# Patient Record
Sex: Female | Born: 1990 | Hispanic: Yes | Marital: Single | Smoking: Never smoker
Health system: Southern US, Community
[De-identification: ages and names within clinical notes are randomized; demographics above are authoritative.]

## PROBLEM LIST (undated history)

## (undated) ENCOUNTER — Inpatient Hospital Stay (HOSPITAL_COMMUNITY): Payer: Self-pay

## (undated) DIAGNOSIS — D696 Thrombocytopenia, unspecified: Secondary | ICD-10-CM

## (undated) HISTORY — PX: WISDOM TOOTH EXTRACTION: SHX21

---
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
## 2008-09-08 ENCOUNTER — Ambulatory Visit: Payer: Self-pay | Admitting: Family Medicine

## 2008-09-08 LAB — CONVERTED CEMR LAB
Anti Nuclear Antibody(ANA): NEGATIVE
Rhuematoid fact SerPl-aCnc: <20 intl units/mL (ref 0–20)
TSH: 0.904 microintl units/mL (ref 0.350–4.500)

## 2008-09-13 ENCOUNTER — Ambulatory Visit (HOSPITAL_COMMUNITY): Admission: RE | Admit: 2008-09-13 | Discharge: 2008-09-13 | Payer: Self-pay | Admitting: Family Medicine

## 2009-12-19 ENCOUNTER — Emergency Department (HOSPITAL_COMMUNITY): Admission: EM | Admit: 2009-12-19 | Discharge: 2009-12-19 | Payer: Self-pay | Admitting: Emergency Medicine

## 2011-05-20 NOTE — L&D Delivery Note (Signed)
Delivery Note At 7:07 PM a viable female was delivered via  (Presentation: ;  ).  APGAR: , ; weight .   Placenta status: , .  Cord:  with the following complications: .  Cord pH: not done  Anesthesia:   Episiotomy:  Lacerations:  Suture Repair: 2.0 vicryl Est. Blood Loss (mL):   Mom to postpartum.  Baby to nursery-stable.  MARSHALL,BERNARD A 01/10/2012, 7:16 PM

## 2011-09-08 LAB — OB RESULTS CONSOLE ABO/RH

## 2011-09-08 LAB — OB RESULTS CONSOLE ANTIBODY SCREEN: Antibody Screen: NEGATIVE

## 2011-09-08 LAB — OB RESULTS CONSOLE GC/CHLAMYDIA
Chlamydia: NEGATIVE
Gonorrhea: NEGATIVE

## 2011-09-08 LAB — OB RESULTS CONSOLE HIV ANTIBODY (ROUTINE TESTING): HIV: NONREACTIVE

## 2012-01-08 ENCOUNTER — Other Ambulatory Visit: Payer: Self-pay | Admitting: Obstetrics

## 2012-01-09 ENCOUNTER — Encounter (HOSPITAL_COMMUNITY): Payer: Self-pay | Admitting: *Deleted

## 2012-01-09 ENCOUNTER — Telehealth (HOSPITAL_COMMUNITY): Payer: Self-pay | Admitting: *Deleted

## 2012-01-09 NOTE — Telephone Encounter (Signed)
Preadmission screen  

## 2012-01-10 ENCOUNTER — Inpatient Hospital Stay (HOSPITAL_COMMUNITY)
Admission: AD | Admit: 2012-01-10 | Discharge: 2012-01-12 | DRG: 775 | Disposition: A | Discharge status: Home / Self Care | Payer: Medicaid Other | Source: Ambulatory Visit | Attending: Obstetrics | Admitting: Obstetrics

## 2012-01-10 ENCOUNTER — Encounter (HOSPITAL_COMMUNITY): Payer: Self-pay | Admitting: Anesthesiology

## 2012-01-10 ENCOUNTER — Encounter (HOSPITAL_COMMUNITY): Payer: Self-pay

## 2012-01-10 ENCOUNTER — Inpatient Hospital Stay (HOSPITAL_COMMUNITY): Payer: Medicaid Other | Admitting: Anesthesiology

## 2012-01-10 ENCOUNTER — Encounter (HOSPITAL_COMMUNITY): Payer: Self-pay | Admitting: *Deleted

## 2012-01-10 LAB — CBC
HCT: 40 % (ref 36.0–46.0)
Hemoglobin: 14 g/dL (ref 12.0–15.0)
MCV: 94.6 fL (ref 78.0–100.0)
RBC: 4.23 MIL/uL (ref 3.87–5.11)
WBC: 15.6 10*3/uL — ABNORMAL HIGH (ref 4.0–10.5)

## 2012-01-10 LAB — RPR: RPR Ser Ql: NONREACTIVE

## 2012-01-10 LAB — TYPE AND SCREEN: ABO/RH(D): O POS

## 2012-01-10 LAB — ABO/RH: ABO/RH(D): O POS

## 2012-01-10 MED ORDER — SENNOSIDES-DOCUSATE SODIUM 8.6-50 MG PO TABS
2.0000 | ORAL_TABLET | Freq: Every day | ORAL | Status: DC
  Administered 2012-01-10 – 2012-01-11 (×2): 2 via ORAL

## 2012-01-10 MED ORDER — LANOLIN HYDROUS EX OINT: TOPICAL_OINTMENT | CUTANEOUS | Status: DC | PRN

## 2012-01-10 MED ORDER — FLEET ENEMA 7-19 GM/118ML RE ENEM: 1.0000 | ENEMA | RECTAL | Status: DC | PRN

## 2012-01-10 MED ORDER — PROMETHAZINE HCL 25 MG/ML IJ SOLN: 12.5000 mg | Freq: Four times a day (QID) | INTRAMUSCULAR | Status: DC | PRN

## 2012-01-10 MED ORDER — LACTATED RINGERS IV SOLN
500.0000 mL | Freq: Once | INTRAVENOUS | Status: AC
  Administered 2012-01-10: 500 mL via INTRAVENOUS

## 2012-01-10 MED ORDER — ZOLPIDEM TARTRATE 5 MG PO TABS: 5.0000 mg | ORAL_TABLET | Freq: Every evening | ORAL | Status: DC | PRN

## 2012-01-10 MED ORDER — LACTATED RINGERS IV SOLN
500.0000 mL | INTRAVENOUS | Status: DC | PRN
  Administered 2012-01-10: 1000 mL via INTRAVENOUS

## 2012-01-10 MED ORDER — BUTORPHANOL TARTRATE 1 MG/ML IJ SOLN
1.0000 mg | INTRAMUSCULAR | Status: DC | PRN
  Administered 2012-01-10: 1 mg via INTRAVENOUS
  Filled 2012-01-10: qty 1

## 2012-01-10 MED ORDER — PRENATAL MULTIVITAMIN CH
1.0000 | ORAL_TABLET | Freq: Every day | ORAL | Status: DC
  Administered 2012-01-12: 1 via ORAL
  Filled 2012-01-10: qty 1

## 2012-01-10 MED ORDER — PHENYLEPHRINE 40 MCG/ML (10ML) SYRINGE FOR IV PUSH (FOR BLOOD PRESSURE SUPPORT): 80.0000 ug | PREFILLED_SYRINGE | INTRAVENOUS | Status: DC | PRN

## 2012-01-10 MED ORDER — OXYCODONE-ACETAMINOPHEN 5-325 MG PO TABS
1.0000 | ORAL_TABLET | ORAL | Status: DC | PRN
  Administered 2012-01-11 – 2012-01-12 (×2): 1 via ORAL
  Filled 2012-01-10 (×2): qty 1

## 2012-01-10 MED ORDER — LACTATED RINGERS IV SOLN
INTRAVENOUS | Status: DC
  Administered 2012-01-10 (×2): via INTRAVENOUS

## 2012-01-10 MED ORDER — IBUPROFEN 600 MG PO TABS
600.0000 mg | ORAL_TABLET | Freq: Four times a day (QID) | ORAL | Status: DC | PRN
  Administered 2012-01-11: 600 mg via ORAL
  Filled 2012-01-10 (×2): qty 1

## 2012-01-10 MED ORDER — TETANUS-DIPHTH-ACELL PERTUSSIS 5-2.5-18.5 LF-MCG/0.5 IM SUSP
0.5000 mL | Freq: Once | INTRAMUSCULAR | Status: AC
  Administered 2012-01-12: 0.5 mL via INTRAMUSCULAR
  Filled 2012-01-10: qty 0.5

## 2012-01-10 MED ORDER — DIPHENHYDRAMINE HCL 25 MG PO CAPS: 25.0000 mg | ORAL_CAPSULE | Freq: Four times a day (QID) | ORAL | Status: DC | PRN

## 2012-01-10 MED ORDER — OXYTOCIN 40 UNITS IN LACTATED RINGERS INFUSION - SIMPLE MED
62.5000 mL/h | Freq: Once | INTRAVENOUS | Status: AC
  Administered 2012-01-10: 62.5 mL/h via INTRAVENOUS
  Filled 2012-01-10: qty 1000

## 2012-01-10 MED ORDER — SIMETHICONE 80 MG PO CHEW: 80.0000 mg | CHEWABLE_TABLET | ORAL | Status: DC | PRN

## 2012-01-10 MED ORDER — ONDANSETRON HCL 4 MG/2ML IJ SOLN: 4.0000 mg | Freq: Four times a day (QID) | INTRAMUSCULAR | Status: DC | PRN

## 2012-01-10 MED ORDER — LIDOCAINE HCL (PF) 1 % IJ SOLN
30.0000 mL | INTRAMUSCULAR | Status: DC | PRN
  Filled 2012-01-10: qty 30

## 2012-01-10 MED ORDER — FENTANYL 2.5 MCG/ML BUPIVACAINE 1/10 % EPIDURAL INFUSION (WH - ANES)
INTRAMUSCULAR | Status: DC | PRN
  Administered 2012-01-10: 14 mL/h via EPIDURAL

## 2012-01-10 MED ORDER — IBUPROFEN 600 MG PO TABS
600.0000 mg | ORAL_TABLET | Freq: Four times a day (QID) | ORAL | Status: DC
  Administered 2012-01-10 – 2012-01-12 (×5): 600 mg via ORAL
  Filled 2012-01-10 (×3): qty 1

## 2012-01-10 MED ORDER — OXYCODONE-ACETAMINOPHEN 5-325 MG PO TABS
1.0000 | ORAL_TABLET | ORAL | Status: DC | PRN
  Administered 2012-01-11: 1 via ORAL
  Filled 2012-01-10: qty 1

## 2012-01-10 MED ORDER — ONDANSETRON HCL 4 MG PO TABS: 4.0000 mg | ORAL_TABLET | ORAL | Status: DC | PRN

## 2012-01-10 MED ORDER — BENZOCAINE-MENTHOL 20-0.5 % EX AERO
1.0000 "application " | INHALATION_SPRAY | CUTANEOUS | Status: DC | PRN
  Administered 2012-01-11: 1 via TOPICAL
  Filled 2012-01-10: qty 56

## 2012-01-10 MED ORDER — OXYTOCIN BOLUS FROM INFUSION
250.0000 mL | Freq: Once | INTRAVENOUS | Status: DC
  Filled 2012-01-10: qty 500

## 2012-01-10 MED ORDER — DIBUCAINE 1 % RE OINT: 1.0000 "application " | TOPICAL_OINTMENT | RECTAL | Status: DC | PRN

## 2012-01-10 MED ORDER — FERROUS SULFATE 325 (65 FE) MG PO TABS
325.0000 mg | ORAL_TABLET | Freq: Two times a day (BID) | ORAL | Status: DC
  Administered 2012-01-11 – 2012-01-12 (×3): 325 mg via ORAL
  Filled 2012-01-10 (×3): qty 1

## 2012-01-10 MED ORDER — DIPHENHYDRAMINE HCL 50 MG/ML IJ SOLN: 12.5000 mg | INTRAMUSCULAR | Status: DC | PRN

## 2012-01-10 MED ORDER — LIDOCAINE HCL (PF) 1 % IJ SOLN
INTRAMUSCULAR | Status: DC | PRN
  Administered 2012-01-10: 4 mL
  Administered 2012-01-10: 30 mL
  Administered 2012-01-10: 4 mL

## 2012-01-10 MED ORDER — ACETAMINOPHEN 325 MG PO TABS: 650.0000 mg | ORAL_TABLET | ORAL | Status: DC | PRN

## 2012-01-10 MED ORDER — EPHEDRINE 5 MG/ML INJ: 10.0000 mg | INTRAVENOUS | Status: DC | PRN

## 2012-01-10 MED ORDER — EPHEDRINE 5 MG/ML INJ
10.0000 mg | INTRAVENOUS | Status: DC | PRN
  Filled 2012-01-10: qty 4

## 2012-01-10 MED ORDER — ONDANSETRON HCL 4 MG/2ML IJ SOLN: 4.0000 mg | INTRAMUSCULAR | Status: DC | PRN

## 2012-01-10 MED ORDER — CITRIC ACID-SODIUM CITRATE 334-500 MG/5ML PO SOLN: 30.0000 mL | ORAL | Status: DC | PRN

## 2012-01-10 MED ORDER — FENTANYL 2.5 MCG/ML BUPIVACAINE 1/10 % EPIDURAL INFUSION (WH - ANES)
14.0000 mL/h | INTRAMUSCULAR | Status: DC
  Administered 2012-01-10: 14 mL/h via EPIDURAL
  Filled 2012-01-10 (×2): qty 60

## 2012-01-10 MED ORDER — WITCH HAZEL-GLYCERIN EX PADS: 1.0000 "application " | MEDICATED_PAD | CUTANEOUS | Status: DC | PRN

## 2012-01-10 MED ORDER — PHENYLEPHRINE 40 MCG/ML (10ML) SYRINGE FOR IV PUSH (FOR BLOOD PRESSURE SUPPORT)
80.0000 ug | PREFILLED_SYRINGE | INTRAVENOUS | Status: DC | PRN
  Filled 2012-01-10: qty 5

## 2012-01-10 NOTE — Anesthesia Procedure Notes (Addendum)
Epidural Patient location during procedure: OB Start time: 01/10/2012 12:34 PM  Staffing Anesthesiologist: Malayjah Otoole A. Performed by: anesthesiologist   Preanesthetic Checklist Completed: patient identified, site marked, surgical consent, pre-op evaluation, timeout performed, IV checked, risks and benefits discussed and monitors and equipment checked  Epidural Patient position: sitting Prep: site prepped and draped and DuraPrep Patient monitoring: continuous pulse ox and blood pressure Approach: midline Injection technique: LOR air  Needle:  Needle type: Tuohy  Needle gauge: 17 G Needle length: 9 cm Needle insertion depth: 5 cm cm Catheter type: closed end flexible Catheter size: 19 Gauge Catheter at skin depth: 10 cm Test dose: negative and Other  Assessment Events: blood not aspirated, injection not painful, no injection resistance, negative IV test and no paresthesia  Additional Notes Patient identified. Risks and benefits discussed including failed block, incomplete  Pain control, post dural puncture headache, nerve damage, paralysis, blood pressure Changes, nausea, vomiting, reactions to medications-both toxic and allergic and post Partum back pain. All questions were answered. Patient expressed understanding and wished to proceed. Sterile technique was used throughout procedure. Epidural site was Dressed with sterile barrier dressing. No paresthesias, signs of intravascular injection Or signs of intrathecal spread were encountered.  Patient was more comfortable after the epidural was dosed. Please see RN's note for documentation of vital signs and FHR which are stable. Will recheck platelet count prior to removal of epidural.

## 2012-01-10 NOTE — H&P (Signed)
This is Dr. Francoise Ceo dictating the history and physical on Brittany Vargas she's a 21 year old gravida 3 para [id number] at 40 weeks and 4 days admitted in labor [facility] 01/06/2012 she is on low-dose Pitocin her membranes were ruptured artificially the fluid was clear negative GBS IUPC inserted and she is now 5-6 cm dilated 100% effaced with the vertex at a 0 station Past medical history negative Past surgical history negative Social history negative Physical exam well-developed female in labor HEENT negative Lungs clear to P&A Breasts negative Heart regular rhythm no murmurs no gallops Abdomen term Pelvic as described above Extremities negative

## 2012-01-10 NOTE — Progress Notes (Signed)
Transferred to 108 via stretcher with infant

## 2012-01-10 NOTE — Progress Notes (Signed)
Delivery of live viable female by Dr Federico Flake 9,9

## 2012-01-10 NOTE — MAU Note (Signed)
Contractions strong since yesterday every 5 minutes patient is G1 [redacted]w[redacted]d, some bloody show.

## 2012-01-10 NOTE — Anesthesia Preprocedure Evaluation (Signed)

## 2012-01-11 LAB — CBC
MCHC: 34.4 g/dL (ref 30.0–36.0)
RDW: 13.5 % (ref 11.5–15.5)
WBC: 16.1 10*3/uL — ABNORMAL HIGH (ref 4.0–10.5)

## 2012-01-11 NOTE — Progress Notes (Signed)
Patient ID: Brittany Vargas, female   DOB: July 30, 1990, 21 y.o.   MRN: [id number] Postpartum day one Vital signs normal Fundus firm Legs negative No complaints

## 2012-01-11 NOTE — Anesthesia Postprocedure Evaluation (Signed)
  Anesthesia Post-op Note  Patient: Brittany Vargas  Procedure(s) Performed: * No procedures listed *  Patient Location: PACU and Women's Unit  Anesthesia Type: Epidural  Level of Consciousness: awake, alert  and oriented  Airway and Oxygen Therapy: Patient Spontanous Breathing  Post-op Pain: mild  Post-op Assessment: Patient's Cardiovascular Status Stable and Respiratory Function Stable  Post-op Vital Signs: Reviewed and stable  Complications: No apparent anesthesia complications

## 2012-01-12 MED ORDER — IBUPROFEN 600 MG PO TABS: 600.0000 mg | ORAL_TABLET | Freq: Four times a day (QID) | ORAL | Status: AC | PRN

## 2012-01-12 MED ORDER — OXYCODONE-ACETAMINOPHEN 5-325 MG PO TABS: 1.0000 | ORAL_TABLET | ORAL | Status: AC | PRN

## 2012-01-12 NOTE — Progress Notes (Signed)
UR chart review completed.  

## 2012-01-12 NOTE — Progress Notes (Signed)
Post Partum Day 2 Subjective: no complaints  Objective: Blood pressure 90/54, pulse 71, temperature 98.4 F (36.9 C), temperature source Oral, resp. rate 18, height 5\' 4"  (1.626 m), weight 174 lb 9.6 oz (79.198 kg), last menstrual period 04/01/2011, unknown if currently breastfeeding.  Physical Exam:  General: alert and no distress Lochia: appropriate Uterine Fundus: firm Incision: healing well DVT Evaluation: No evidence of DVT seen on physical exam.   Basename 01/11/12 0500 01/10/12 0930  HGB 10.9* 14.0  HCT 31.7* 40.0    Assessment/Plan: Discharge home   LOS: 2 days   HARPER,CHARLES A 01/12/2012, 8:28 AM

## 2012-01-12 NOTE — Discharge Summary (Signed)
Obstetric Discharge Summary Reason for Admission: onset of labor Prenatal Procedures: none Intrapartum Procedures: spontaneous vaginal delivery Postpartum Procedures: none Complications-Operative and Postpartum: none Hemoglobin  Date Value Range Status  01/11/2012 10.9* 12.0 - 15.0 g/dL Final     DELTA CHECK NOTED     REPEATED TO VERIFY     HCT  Date Value Range Status  01/11/2012 31.7* 36.0 - 46.0 % Final    Physical Exam:  General: alert and no distress Lochia: appropriate Uterine Fundus: firm Incision: healing well DVT Evaluation: No evidence of DVT seen on physical exam.  Discharge Diagnoses: Term Pregnancy-delivered  Discharge Information: Date: 01/12/2012 Activity: pelvic rest Diet: routine Medications: PNV, Ibuprofen, Colace and Percocet Condition: stable Instructions: refer to practice specific booklet Discharge to: home Follow-up Information    Follow up with Rajah Tagliaferro A, MD. Schedule an appointment as soon as possible for a visit in 6 weeks.   Contact information:   [address] [place] [address] [phone]          Newborn Data: Live born female  Birth Weight: 8 lb 5.2 oz (3775 g) APGAR: 9, 9  Home with mother.  Brittany Vargas A 01/12/2012, 8:33 AM

## 2012-01-14 ENCOUNTER — Inpatient Hospital Stay (HOSPITAL_COMMUNITY): Admission: RE | Admit: 2012-01-14 | Payer: Self-pay | Source: Ambulatory Visit

## 2012-03-01 ENCOUNTER — Inpatient Hospital Stay (HOSPITAL_COMMUNITY)
Admission: AD | Admit: 2012-03-01 | Discharge: 2012-03-05 | DRG: 690 | Disposition: A | Discharge status: Home / Self Care | Payer: Medicaid Other | Source: Ambulatory Visit | Attending: Obstetrics | Admitting: Obstetrics

## 2012-03-01 ENCOUNTER — Encounter (HOSPITAL_COMMUNITY): Payer: Self-pay | Admitting: Obstetrics

## 2012-03-01 DIAGNOSIS — N10 Acute tubulo-interstitial nephritis: Principal | ICD-10-CM | POA: Diagnosis present

## 2012-03-01 DIAGNOSIS — A498 Other bacterial infections of unspecified site: Secondary | ICD-10-CM | POA: Diagnosis present

## 2012-03-01 LAB — CBC WITH DIFFERENTIAL/PLATELET
Basophils Relative: 0 % (ref 0–1)
Eosinophils Absolute: 0 10*3/uL (ref 0.0–0.7)
Lymphs Abs: 0.7 10*3/uL (ref 0.7–4.0)
MCH: 31.4 pg (ref 26.0–34.0)
MCHC: 34.9 g/dL (ref 30.0–36.0)
Neutro Abs: 6.2 10*3/uL (ref 1.7–7.7)
Neutrophils Relative %: 79 % — ABNORMAL HIGH (ref 43–77)
Platelets: 103 10*3/uL — ABNORMAL LOW (ref 150–400)
RBC: 4.33 MIL/uL (ref 3.87–5.11)

## 2012-03-01 LAB — COMPREHENSIVE METABOLIC PANEL
AST: 103 U/L — ABNORMAL HIGH (ref 0–37)
CO2: 24 mEq/L (ref 19–32)
Chloride: 96 mEq/L (ref 96–112)
Creatinine, Ser: 0.74 mg/dL (ref 0.50–1.10)
GFR calc Af Amer: >90 mL/min (ref >90)
GFR calc non Af Amer: >90 mL/min (ref >90)
Glucose, Bld: 99 mg/dL (ref 70–99)
Total Bilirubin: 0.6 mg/dL (ref 0.3–1.2)

## 2012-03-01 MED ORDER — DEXTROSE-NACL 5-0.45 % IV SOLN
INTRAVENOUS | Status: DC
  Administered 2012-03-01 – 2012-03-05 (×9): via INTRAVENOUS

## 2012-03-01 MED ORDER — OXYCODONE HCL 5 MG PO TABS
10.0000 mg | ORAL_TABLET | ORAL | Status: DC | PRN
  Administered 2012-03-02 – 2012-03-04 (×5): 10 mg via ORAL
  Filled 2012-03-01 (×2): qty 1
  Filled 2012-03-01 (×5): qty 2

## 2012-03-01 MED ORDER — ALUM & MAG HYDROXIDE-SIMETH 200-200-20 MG/5ML PO SUSP: 30.0000 mL | Freq: Four times a day (QID) | ORAL | Status: DC | PRN

## 2012-03-01 MED ORDER — DOCUSATE SODIUM 100 MG PO CAPS
100.0000 mg | ORAL_CAPSULE | Freq: Two times a day (BID) | ORAL | Status: DC
  Administered 2012-03-01 – 2012-03-04 (×7): 100 mg via ORAL
  Filled 2012-03-01 (×7): qty 1

## 2012-03-01 MED ORDER — OXYCODONE HCL 5 MG PO TABS
5.0000 mg | ORAL_TABLET | ORAL | Status: DC | PRN
  Administered 2012-03-01: 5 mg via ORAL
  Filled 2012-03-01: qty 1

## 2012-03-01 MED ORDER — ADULT MULTIVITAMIN W/MINERALS CH
1.0000 | ORAL_TABLET | Freq: Every day | ORAL | Status: DC
  Administered 2012-03-01 – 2012-03-04 (×4): 1 via ORAL
  Filled 2012-03-01 (×5): qty 1

## 2012-03-01 MED ORDER — PROMETHAZINE HCL 25 MG/ML IJ SOLN
25.0000 mg | Freq: Four times a day (QID) | INTRAMUSCULAR | Status: DC | PRN
  Administered 2012-03-03: 25 mg via INTRAMUSCULAR
  Filled 2012-03-01 (×2): qty 1

## 2012-03-01 MED ORDER — ACETAMINOPHEN 500 MG PO TABS
1000.0000 mg | ORAL_TABLET | Freq: Four times a day (QID) | ORAL | Status: DC | PRN
  Administered 2012-03-01: 1000 mg via ORAL
  Filled 2012-03-01: qty 2

## 2012-03-01 MED ORDER — ZOLPIDEM TARTRATE 5 MG PO TABS: 5.0000 mg | ORAL_TABLET | Freq: Every evening | ORAL | Status: DC | PRN

## 2012-03-01 MED ORDER — MORPHINE SULFATE 10 MG/ML IJ SOLN: 10.0000 mg | Freq: Four times a day (QID) | INTRAMUSCULAR | Status: DC | PRN

## 2012-03-01 MED ORDER — ACETAMINOPHEN 500 MG PO TABS
500.0000 mg | ORAL_TABLET | Freq: Four times a day (QID) | ORAL | Status: DC | PRN
  Administered 2012-03-02 – 2012-03-04 (×4): 500 mg via ORAL
  Filled 2012-03-01 (×4): qty 1

## 2012-03-01 MED ORDER — INFLUENZA VIRUS VACC SPLIT PF IM SUSP
0.5000 mL | INTRAMUSCULAR | Status: AC
  Filled 2012-03-01: qty 0.5

## 2012-03-01 MED ORDER — DEXTROSE 5 % IV SOLN
1.0000 g | Freq: Two times a day (BID) | INTRAVENOUS | Status: DC
  Administered 2012-03-01 – 2012-03-04 (×7): 1 g via INTRAVENOUS
  Filled 2012-03-01 (×8): qty 10

## 2012-03-01 MED ORDER — PROMETHAZINE HCL 25 MG PO TABS
12.5000 mg | ORAL_TABLET | Freq: Four times a day (QID) | ORAL | Status: DC | PRN
  Filled 2012-03-01 (×2): qty 1

## 2012-03-01 NOTE — Progress Notes (Signed)
At 18:00, pt complaining of pain 6/10 in left flank 1 hour after taking oxycodone 5mg  PO.  Pain before medication 7/10.  Pt also with Temp of 103.  Left VM for Dr. Tamela Oddi.  At 18:30 phoned Dr. Clearance Coots and updated him about above information.  See orders for Tylenol and additional pain medication.

## 2012-03-01 NOTE — H&P (Signed)
Brittany Vargas is an 21 y.o. female. Presented to office with fever, dysuria and left flank pain.  Pertinent Gynecological History: Menses: ~ 6 weeks postpartum. Bleeding: none Contraception: none DES exposure: denies Blood transfusions: none Sexually transmitted diseases: no past history Previous GYN Procedures: none  Last mammogram: n/a Date: n/a Last pap: normal Date: 2012 OB History: G3, P1   Menstrual History: Menarche age: 20 No LMP recorded.    Past Medical History  Diagnosis Date  . No pertinent past medical history     Past Surgical History  Procedure Date  . No past surgeries     No family history on file.  Social History:  reports that she has never smoked. She has never used smokeless tobacco. She reports that she does not drink alcohol or use illicit drugs.  Allergies: No Known Allergies  Prescriptions prior to admission  Medication Sig Dispense Refill  . acetaminophen (TYLENOL) 500 MG tablet Take 500 mg by mouth every 6 (six) hours as needed. headache        Review of Systems  Constitutional: Positive for fever.  Genitourinary: Positive for dysuria and flank pain.  Musculoskeletal: Positive for myalgias.  All other systems reviewed and are negative.    Blood pressure 127/74, pulse 100, temperature 102.6 F (39.2 C), temperature source Oral, resp. rate 18, SpO2 100.00%, unknown if currently breastfeeding. Physical Exam  Nursing note and vitals reviewed. Constitutional: She is oriented to person, place, and time. She appears well-developed and well-nourished.  HENT:  Head: Normocephalic and atraumatic.  Eyes: Conjunctivae normal are normal. Pupils are equal, round, and reactive to light.  Neck: Normal range of motion. Neck supple.  Cardiovascular: Normal rate and regular rhythm.   Respiratory: Effort normal and breath sounds normal.  GI: Soft.  Genitourinary: Vagina normal and uterus normal.  Musculoskeletal: Normal range of motion.    Neurological: She is alert and oriented to person, place, and time.  Skin: Skin is warm and dry.  Psychiatric: She has a normal mood and affect. Her behavior is normal. Judgment and thought content normal.    Results for orders placed during the hospital encounter of 03/01/12 (from the past 24 hour(s))  COMPREHENSIVE METABOLIC PANEL     Status: Abnormal   Collection Time   03/01/12  4:04 PM      Component Value Range   Sodium 134 (*) 135 - 145 mEq/L   Potassium 3.6  3.5 - 5.1 mEq/L   Chloride 96  96 - 112 mEq/L   CO2 24  19 - 32 mEq/L   Glucose, Bld 99  70 - 99 mg/dL   BUN 8  6 - 23 mg/dL   Creatinine, Ser 1.61  0.50 - 1.10 mg/dL   Calcium 8.9  8.4 - 09.6 mg/dL   Total Protein 8.2  6.0 - 8.3 g/dL   Albumin 3.4 (*) 3.5 - 5.2 g/dL   AST 045 (*) 0 - 37 U/L   ALT 87 (*) 0 - 35 U/L   Alkaline Phosphatase 209 (*) 39 - 117 U/L   Total Bilirubin 0.6  0.3 - 1.2 mg/dL   GFR calc non Af Amer >90  >90 mL/min   GFR calc Af Amer >90  >90 mL/min  CBC WITH DIFFERENTIAL     Status: Abnormal   Collection Time   03/01/12  4:04 PM      Component Value Range   WBC 7.8  4.0 - 10.5 K/uL   RBC 4.33  3.87 - 5.11  MIL/uL   Hemoglobin 13.6  12.0 - 15.0 g/dL   HCT 11.9  14.7 - 82.9 %   MCV 90.1  78.0 - 100.0 fL   MCH 31.4  26.0 - 34.0 pg   MCHC 34.9  30.0 - 36.0 g/dL   RDW 56.2  13.0 - 86.5 %   Platelets 103 (*) 150 - 400 K/uL   Neutrophils Relative 79 (*) 43 - 77 %   Neutro Abs 6.2  1.7 - 7.7 K/uL   Lymphocytes Relative 9 (*) 12 - 46 %   Lymphs Abs 0.7  0.7 - 4.0 K/uL   Monocytes Relative 12  3 - 12 %   Monocytes Absolute 0.9  0.1 - 1.0 K/uL   Eosinophils Relative 0  0 - 5 %   Eosinophils Absolute 0.0  0.0 - 0.7 K/uL   Basophils Relative 0  0 - 1 %   Basophils Absolute 0.0  0.0 - 0.1 K/uL    No results found.  Assessment/Plan: Acute pyelonephritis.  Admit.  Start IV Rocephin.  HARPER,CHARLES A 03/01/2012, 5:28 PM

## 2012-03-02 NOTE — Progress Notes (Signed)
UR Chart review completed.  

## 2012-03-02 NOTE — Progress Notes (Signed)
Subjective: Patient reports less pain.  Objective: I have reviewed patient's vital signs, intake and output, medications, labs and microbiology.  General: alert and no distress GI: soft, non-tender; bowel sounds normal; no masses,  no organomegaly Extremities: extremities normal, atraumatic, no cyanosis or edema Vaginal Bleeding: minimal Back:  Mild left flank tenderness.   Assessment/Plan: Acute pyelonephritis.  Stable.  Continue IV Rocephin.  LOS: 1 day    Mayumi Summerson A 03/02/2012, 8:40 AM

## 2012-03-03 LAB — URINE CULTURE

## 2012-03-03 NOTE — Progress Notes (Signed)
Subjective: Patient reports less pain.  Objective: I have reviewed patient's vital signs, intake and output, medications, labs and microbiology.  General: alert and no distress GI: abnormal findings:  Positive tenderness left flank, less. Extremities: extremities normal, atraumatic, no cyanosis or edema Vaginal Bleeding: none  Blood culture:  Positive gram positive cocci in clusters on right side.  No growth on left side. Urine culture:   > 1000,000 E. Coli.  Check organism ID in + blood culture.  May need Vancomycin if + Staph. Aureus. Assessment/Plan: Acute pyelonephritis.  Stable.  Continue Rocephin.  LOS: 2 days    HARPER,CHARLES A 03/03/2012, 5:20 AM

## 2012-03-04 LAB — CBC WITH DIFFERENTIAL/PLATELET
Basophils Relative: 0 % (ref 0–1)
Eosinophils Absolute: 0.2 10*3/uL (ref 0.0–0.7)
HCT: 29 % — ABNORMAL LOW (ref 36.0–46.0)
Hemoglobin: 10 g/dL — ABNORMAL LOW (ref 12.0–15.0)
Lymphocytes Relative: 23 % (ref 12–46)
MCH: 31.3 pg (ref 26.0–34.0)
MCHC: 34.5 g/dL (ref 30.0–36.0)
MCV: 90.9 fL (ref 78.0–100.0)
Monocytes Absolute: 0.6 10*3/uL (ref 0.1–1.0)
Neutro Abs: 3.5 10*3/uL (ref 1.7–7.7)

## 2012-03-04 LAB — CULTURE, BLOOD (ROUTINE X 2)

## 2012-03-05 MED ORDER — OXYCODONE HCL 10 MG PO TABS
10.0000 mg | ORAL_TABLET | ORAL | Status: DC | PRN
Start: 2012-03-05 — End: 2013-07-18

## 2012-03-05 MED ORDER — INFLUENZA VIRUS VACC SPLIT PF IM SUSP
0.5000 mL | Freq: Once | INTRAMUSCULAR | Status: AC
  Administered 2012-03-05: 0.5 mL via INTRAMUSCULAR

## 2012-03-05 MED ORDER — FUSION PLUS PO CAPS: 1.0000 | ORAL_CAPSULE | Freq: Every day | ORAL | Status: DC

## 2012-03-05 NOTE — Discharge Summary (Signed)
Physician Discharge Summary  Patient ID: Brittany Vargas MRN: [id number] DOB/AGE: [birth year] 21 y.o.  Admit date: 03/01/2012 Discharge date: 03/05/2012  Admission Diagnoses:  Acute pyelonephritis  Discharge Diagnoses:   Same                                                                                                                                                                                                                                                                Active Problems:  * No active hospital problems. *    Discharged Condition: good  Hospital Course: Admitted with pyelonephritis.  Treated with IV antibiotics and responded well.  Afebrile for 48 hours.  No pain.  Consults: None  Significant Diagnostic Studies: labs: CBC, CMET and microbiology: blood culture: negative and urine culture: positive for E. coli  Treatments: IV hydration, antibiotics: ceftriaxone and analgesia: acetaminophen and Percocet.  Discharge Exam: Blood pressure 100/55, pulse 78, temperature 97.9 F (36.6 C), temperature source Oral, resp. rate 15, height 5\' 4"  (1.626 m), weight 69.854 kg (154 lb), SpO2 97.00%, unknown if currently breastfeeding. General appearance: alert and no distress Back: symmetric, no curvature. ROM normal. No CVA tenderness. GI: normal findings: soft, non-tender Extremities: extremities normal, atraumatic, no cyanosis or edema  Disposition: 01-Home or Self Care  Discharge Orders    Future Orders Please Complete By Expires   Diet - low sodium heart healthy      Increase activity slowly      Discharge instructions      Comments:   Routine   Call MD for:      Call MD for:  temperature >100.4      Call MD for:  persistant nausea and vomiting      Call MD for:  severe uncontrolled pain      Call MD for:  redness, tenderness, or signs of infection (pain, swelling, redness, odor or green/yellow discharge around incision site)      Call MD for:  difficulty  breathing, headache or visual disturbances      Call MD for:  hives      Call MD for:  persistant dizziness or light-headedness      Call MD for:  extreme fatigue      (HEART FAILURE PATIENTS) Call MD:  Nadyne Coombes  you have any of the following symptoms: 1) 3 pound weight gain in 24 hours or 5 pounds in 1 week 2) shortness of breath, with or without a dry hacking cough 3) swelling in the hands, feet or stomach 4) if you have to sleep on extra pillows at night in order to breathe.      Discharge patient      Discharge patient          Medication List     As of 03/05/2012  5:19 AM    TAKE these medications         acetaminophen 500 MG tablet   Commonly known as: TYLENOL   Take 500 mg by mouth every 6 (six) hours as needed. headache      Oxycodone HCl 10 MG Tabs   Take 1 tablet (10 mg total) by mouth every 4 (four) hours as needed.           Follow-up Information    Follow up with HARPER,CHARLES A, MD. Schedule an appointment as soon as possible for a visit in 2 weeks.   Contact information:   [address] [place] [address] [phone]          Signed: HARPER,CHARLES A 03/05/2012, 5:19 AM

## 2012-03-05 NOTE — Progress Notes (Signed)
Patient discharged home.  Patient verbalized understanding of discharge intructions.

## 2012-03-05 NOTE — Progress Notes (Signed)
Subjective: Patient reports less pain.  Objective: I have reviewed patient's vital signs, intake and output, medications, labs and microbiology.  General: alert and no distress GI: normal findings: soft, non-tender Extremities: extremities normal, atraumatic, no cyanosis or edema Vaginal Bleeding: none   Assessment/Plan: Acute pyelonephritis.  Improved   LOS: 4 days   Discharge home on p.o. Ceftin x 7 days.   HARPER,CHARLES A 03/05/2012, 5:11 AM

## 2012-03-07 LAB — CULTURE, BLOOD (ROUTINE X 2)

## 2013-05-19 NOTE — L&D Delivery Note (Signed)
Delivery Note At 9:00 PM a viable female was delivered via Vaginal, Spontaneous Delivery (Presentation: Left Occiput Anterior).  APGAR: 9, 9; weight 9 lb 3.6 oz (4185 g).   Placenta status: Intact, Spontaneous.  Cord: 3 vessels with the following complications: None.  Cord pH: none  Anesthesia: Epidural  Episiotomy: None Lacerations: 2nd degree;Perineal Suture Repair: 2.0 chromic Est. Blood Loss (mL): 400  Mom to postpartum.  Baby to Couplet care / Skin to Skin.  Somer Trotter A 02/28/2014, 10:41 PM

## 2013-06-10 ENCOUNTER — Other Ambulatory Visit: Payer: Self-pay | Admitting: Family Medicine

## 2013-06-10 ENCOUNTER — Other Ambulatory Visit (HOSPITAL_COMMUNITY)
Admission: RE | Admit: 2013-06-10 | Discharge: 2013-06-10 | Disposition: A | Discharge status: Home / Self Care | Payer: Medicaid Other | Source: Ambulatory Visit | Attending: Family Medicine | Admitting: Family Medicine

## 2013-06-10 DIAGNOSIS — Z113 Encounter for screening for infections with a predominantly sexual mode of transmission: Secondary | ICD-10-CM | POA: Insufficient documentation

## 2013-06-10 DIAGNOSIS — N760 Acute vaginitis: Secondary | ICD-10-CM | POA: Insufficient documentation

## 2013-06-10 DIAGNOSIS — Z01419 Encounter for gynecological examination (general) (routine) without abnormal findings: Secondary | ICD-10-CM | POA: Insufficient documentation

## 2013-07-18 ENCOUNTER — Ambulatory Visit (INDEPENDENT_AMBULATORY_CARE_PROVIDER_SITE_OTHER): Payer: Medicaid Other | Admitting: Obstetrics

## 2013-07-18 ENCOUNTER — Encounter: Payer: Self-pay | Admitting: Obstetrics

## 2013-07-18 VITALS — BP 111/70 | Temp 98.0°F | Wt 157.0 lb

## 2013-07-18 DIAGNOSIS — Z3480 Encounter for supervision of other normal pregnancy, unspecified trimester: Secondary | ICD-10-CM

## 2013-07-18 DIAGNOSIS — Z3201 Encounter for pregnancy test, result positive: Secondary | ICD-10-CM

## 2013-07-18 DIAGNOSIS — Z3200 Encounter for pregnancy test, result unknown: Secondary | ICD-10-CM

## 2013-07-18 DIAGNOSIS — Z3687 Encounter for antenatal screening for uncertain dates: Secondary | ICD-10-CM

## 2013-07-18 LAB — POCT URINE PREGNANCY: Preg Test, Ur: POSITIVE

## 2013-07-18 LAB — OB RESULTS CONSOLE GC/CHLAMYDIA
Chlamydia: NEGATIVE
Gonorrhea: NEGATIVE

## 2013-07-18 LAB — POCT URINALYSIS DIPSTICK
BILIRUBIN UA: NEGATIVE
Blood, UA: NEGATIVE
Glucose, UA: NEGATIVE
KETONES UA: NEGATIVE
Leukocytes, UA: NEGATIVE
Nitrite, UA: NEGATIVE
PH UA: 6
PROTEIN UA: NEGATIVE
SPEC GRAV UA: 1.02
Urobilinogen, UA: NEGATIVE

## 2013-07-18 NOTE — Addendum Note (Signed)
Addended by: Marya LandryFOSTER, Daira Hine D on: 07/18/2013 02:10 PM   Modules accepted: Orders

## 2013-07-18 NOTE — Progress Notes (Signed)
Pulse 76   Subjective:    Brittany Vargas is being seen today for her first obstetrical visit.  This is a planned pregnancy. She is at 3543w0d gestation. Her obstetrical history is significant for none. Relationship with FOB: not living together. Patient does intend to breast feed.  Pt has no complaints today.  Pt states that she had 2 menstral cycles in the month of December.  Pt states that she had a cycle on 12/15-19 that was heavy normal cycle.  Pt states she then had lighter spotting cycle on 12/29-1/4.   Pregnancy history fully reviewed.  Menstrual History: OB History   Grav Para Term Preterm Abortions TAB SAB Ect Mult Living   4 1 1  2 2    1       Menarche age: [age] Patient's last menstrual period was 05/16/2013.    The following portions of the patient's history were reviewed and updated as appropriate: allergies, current medications, past family history, past medical history, past social history, past surgical history and problem list.  Review of Systems Pertinent items are noted in HPI.    Objective:    General appearance: alert and no distress Breasts: normal appearance, no masses or tenderness Abdomen: normal findings: soft, non-tender Pelvic: cervix normal in appearance, external genitalia normal, no adnexal masses or tenderness, no cervical motion tenderness, rectovaginal septum normal, uterus normal size, shape, and consistency and vagina normal without discharge Extremities: extremities normal, atraumatic, no cyanosis or edema    Assessment:    Pregnancy at 5343w0d weeks      Plan:    Initial labs drawn. Prenatal vitamins.  Counseling provided regarding continued use of seat belts, cessation of alcohol consumption, smoking or use of illicit drugs; infection precautions i.e., influenza/TDAP immunizations, toxoplasmosis,CMV, parvovirus, listeria and varicella; workplace safety, exercise during pregnancy; routine dental care, safe medications, sexual activity, hot tubs,  saunas, pools, travel, caffeine use, fish and methlymercury, potential toxins, hair treatments, varicose veins Weight gain recommendations per IOM guidelines reviewed: underweight/BMI< 18.5--> gain 28 - 40 lbs; normal weight/BMI 18.5 - 24.9--> gain 25 - 35 lbs; overweight/BMI 25 - 29.9--> gain 15 - 25 lbs; obese/BMI >30->gain  11 - 20 lbs Problem list reviewed and updated. FIRST/CF mutation testing/NIPT/QUAD SCREEN discussed: requested. Role of ultrasound in pregnancy discussed; fetal survey: requested. Amniocentesis discussed: not indicated. VBAC calculator score: VBAC consent form provided Follow up in 4 weeks. 50% of 20 min visit spent on counseling and coordination of care.

## 2013-07-19 LAB — GC/CHLAMYDIA PROBE AMP
CT Probe RNA: NEGATIVE
GC Probe RNA: NEGATIVE

## 2013-07-19 LAB — PAP IG W/ RFLX HPV ASCU

## 2013-07-19 LAB — WET PREP BY MOLECULAR PROBE
CANDIDA SPECIES: POSITIVE — AB
Gardnerella vaginalis: NEGATIVE
TRICHOMONAS VAG: NEGATIVE

## 2013-07-19 LAB — CULTURE, OB URINE
Colony Count: NO GROWTH
Organism ID, Bacteria: NO GROWTH

## 2013-07-20 ENCOUNTER — Other Ambulatory Visit: Payer: Self-pay | Admitting: Obstetrics

## 2013-07-20 ENCOUNTER — Ambulatory Visit (INDEPENDENT_AMBULATORY_CARE_PROVIDER_SITE_OTHER): Payer: Medicaid Other

## 2013-07-20 DIAGNOSIS — O3680X0 Pregnancy with inconclusive fetal viability, not applicable or unspecified: Secondary | ICD-10-CM

## 2013-07-20 DIAGNOSIS — Z3687 Encounter for antenatal screening for uncertain dates: Secondary | ICD-10-CM

## 2013-07-20 LAB — US OB TRANSVAGINAL

## 2013-07-26 ENCOUNTER — Other Ambulatory Visit: Payer: Self-pay | Admitting: *Deleted

## 2013-07-26 DIAGNOSIS — B3731 Acute candidiasis of vulva and vagina: Secondary | ICD-10-CM

## 2013-07-26 DIAGNOSIS — B373 Candidiasis of vulva and vagina: Secondary | ICD-10-CM

## 2013-07-26 MED ORDER — FLUCONAZOLE 150 MG PO TABS: 150.0000 mg | ORAL_TABLET | Freq: Once | ORAL | Status: DC

## 2013-08-15 ENCOUNTER — Encounter: Payer: Medicaid Other | Admitting: Obstetrics

## 2013-08-17 ENCOUNTER — Ambulatory Visit (INDEPENDENT_AMBULATORY_CARE_PROVIDER_SITE_OTHER): Payer: Medicaid Other | Admitting: Obstetrics

## 2013-08-17 VITALS — BP 108/71 | Temp 98.0°F | Wt 162.0 lb

## 2013-08-17 DIAGNOSIS — Z3480 Encounter for supervision of other normal pregnancy, unspecified trimester: Secondary | ICD-10-CM

## 2013-08-17 NOTE — Progress Notes (Signed)
Pulse: 67 Patient denies any concerns.

## 2013-08-22 ENCOUNTER — Encounter: Payer: Self-pay | Admitting: Obstetrics

## 2013-09-07 ENCOUNTER — Encounter: Payer: Medicaid Other | Admitting: Obstetrics

## 2013-09-08 ENCOUNTER — Ambulatory Visit (INDEPENDENT_AMBULATORY_CARE_PROVIDER_SITE_OTHER): Payer: Medicaid Other | Admitting: Obstetrics

## 2013-09-08 VITALS — BP 114/73 | HR 89 | Temp 97.9°F | Wt 158.0 lb

## 2013-09-08 DIAGNOSIS — Z3480 Encounter for supervision of other normal pregnancy, unspecified trimester: Secondary | ICD-10-CM

## 2013-09-08 LAB — POCT URINALYSIS DIPSTICK
Bilirubin, UA: NEGATIVE
Blood, UA: NEGATIVE
Glucose, UA: NEGATIVE
KETONES UA: NEGATIVE
Leukocytes, UA: NEGATIVE
Nitrite, UA: NEGATIVE
PH UA: 8
PROTEIN UA: NEGATIVE
UROBILINOGEN UA: NEGATIVE

## 2013-09-09 ENCOUNTER — Encounter: Payer: Self-pay | Admitting: Obstetrics

## 2013-09-09 LAB — AFP, QUAD SCREEN
AFP: 30.4 [IU]/mL
Age Alone: 1:1060 {titer}
CURR GEST AGE: 16.3 wks.days
HCG, Total: 25053 m[IU]/mL
INH: 243.8 pg/mL
INTERPRETATION-AFP: NEGATIVE
MOM FOR INH: 1.6
MoM for AFP: 1.04
MoM for hCG: 1.38
Open Spina bifida: NEGATIVE
Tri 18 Scr Risk Est: NEGATIVE
UE3 MOM: 0.78
uE3 Value: 0.5 ng/mL

## 2013-09-09 NOTE — Progress Notes (Signed)
  Subjective:    Brittany Vargas is a 23 y.o. female being seen today for her obstetrical visit. She is at 7082w4d gestation. Patient reports: backache, nausea and no cramping.   Past Medical History  Diagnosis Date  . No pertinent past medical history     Past Surgical History  Procedure Laterality Date  . No past surgeries      Current outpatient prescriptions:Iron-FA-B Cmp-C-Biot-Probiotic (FUSION PLUS) CAPS, Take 1 capsule by mouth daily before breakfast., Disp: 30 capsule, Rfl: 5;  Prenatal Vit-Fe Fumarate-FA (MULTIVITAMIN-PRENATAL) 27-0.8 MG TABS tablet, Take 1 tablet by mouth daily at 12 noon., Disp: , Rfl:  No Known Allergies  History  Substance Use Topics  . Smoking status: Never Smoker   . Smokeless tobacco: Never Used  . Alcohol Use: No    No family history on file.   Review of Systems Constitutional: negative for anorexia Gastrointestinal: negative for abdominal pain Genitourinary:negative for vaginal discharge Musculoskeletal:positive for back pain Behavioral/Psych: negative for depression and tobacco use   Objective:     BP 114/73  Pulse 89  Temp(Src) 97.9 F (36.6 C)  Wt 158 lb (71.668 kg)  LMP 05/16/2013 Uterine Size: Below umbilicus     Assessment:    Pregnancy @ 6682w4d  weeks Doing well    Plan:    Problem list reviewed and updated. Labs reviewed. Ultrasound ordered for anatomic survey.  Follow up in 4 weeks. FIRST/CF mutation testing/NIPT/QUAD SCREEN/fragile X/Ashkenazi Jewish population testing/Spinal muscular atrophy discussed: requested. Role of ultrasound in pregnancy discussed; fetal survey: requested. Amniocentesis discussed: not indicated.

## 2013-09-30 ENCOUNTER — Other Ambulatory Visit: Payer: Self-pay | Admitting: *Deleted

## 2013-09-30 DIAGNOSIS — Z1389 Encounter for screening for other disorder: Secondary | ICD-10-CM

## 2013-10-04 ENCOUNTER — Ambulatory Visit (INDEPENDENT_AMBULATORY_CARE_PROVIDER_SITE_OTHER): Payer: Medicaid Other | Admitting: Obstetrics

## 2013-10-04 ENCOUNTER — Encounter: Payer: Self-pay | Admitting: Obstetrics

## 2013-10-04 ENCOUNTER — Ambulatory Visit (INDEPENDENT_AMBULATORY_CARE_PROVIDER_SITE_OTHER): Payer: Medicaid Other

## 2013-10-04 VITALS — BP 108/65 | HR 80 | Temp 98.1°F | Wt 162.0 lb

## 2013-10-04 DIAGNOSIS — Z3480 Encounter for supervision of other normal pregnancy, unspecified trimester: Secondary | ICD-10-CM

## 2013-10-04 DIAGNOSIS — Z1389 Encounter for screening for other disorder: Secondary | ICD-10-CM

## 2013-10-04 LAB — POCT URINALYSIS DIPSTICK
BILIRUBIN UA: NEGATIVE
Glucose, UA: NEGATIVE
KETONES UA: NEGATIVE
LEUKOCYTES UA: NEGATIVE
Nitrite, UA: NEGATIVE
PH UA: 7
PROTEIN UA: NEGATIVE
RBC UA: NEGATIVE
SPEC GRAV UA: 1.015
Urobilinogen, UA: NEGATIVE

## 2013-10-04 LAB — US OB COMP + 14 WK

## 2013-10-04 NOTE — Progress Notes (Signed)
Subjective:    Brittany Vargas is a 23 y.o. female being seen today for her obstetrical visit. She is at 2636w1d gestation. Patient reports: no complaints . Fetal movement: normal.  Problem List Items Addressed This Visit   None    Visit Diagnoses   Supervision of other normal pregnancy    -  Primary    Relevant Orders       POCT urinalysis dipstick      Patient Active Problem List   Diagnosis Date Noted  . Unsure of LMP (last menstrual period) as reason for ultrasound scan 07/18/2013   Objective:    BP 108/65  Pulse 80  Temp(Src) 98.1 F (36.7 C)  Wt 162 lb (73.483 kg)  LMP 05/16/2013 FHT: 150 BPM  Uterine Size: size equals dates     Assessment:    Pregnancy @ 3436w1d    Plan:    OBGCT: ordered for next visit.  Labs, problem list reviewed and updated 2 hr GTT planned Follow up in 4 weeks.

## 2013-11-01 ENCOUNTER — Other Ambulatory Visit: Payer: Medicaid Other

## 2013-11-01 ENCOUNTER — Encounter: Payer: Medicaid Other | Admitting: Obstetrics

## 2013-11-10 ENCOUNTER — Encounter: Payer: Medicaid Other | Admitting: Obstetrics

## 2013-11-10 ENCOUNTER — Other Ambulatory Visit: Payer: Medicaid Other

## 2013-11-10 ENCOUNTER — Ambulatory Visit (INDEPENDENT_AMBULATORY_CARE_PROVIDER_SITE_OTHER): Payer: Medicaid Other | Admitting: Obstetrics

## 2013-11-10 VITALS — BP 103/67 | HR 61 | Temp 97.8°F | Wt 167.0 lb

## 2013-11-10 DIAGNOSIS — Z3480 Encounter for supervision of other normal pregnancy, unspecified trimester: Secondary | ICD-10-CM

## 2013-11-10 DIAGNOSIS — Z3482 Encounter for supervision of other normal pregnancy, second trimester: Secondary | ICD-10-CM

## 2013-11-10 LAB — CBC
HEMATOCRIT: 39.5 % (ref 36.0–46.0)
HEMOGLOBIN: 13.5 g/dL (ref 12.0–15.0)
MCH: 33.7 pg (ref 26.0–34.0)
MCHC: 34.2 g/dL (ref 30.0–36.0)
MCV: 98.5 fL (ref 78.0–100.0)
Platelets: 132 10*3/uL — ABNORMAL LOW (ref 150–400)
RBC: 4.01 MIL/uL (ref 3.87–5.11)
RDW: 14.5 % (ref 11.5–15.5)
WBC: 7.6 10*3/uL (ref 4.0–10.5)

## 2013-11-11 ENCOUNTER — Encounter: Payer: Self-pay | Admitting: Obstetrics

## 2013-11-11 LAB — GLUCOSE TOLERANCE, 2 HOURS W/ 1HR
GLUCOSE, 2 HOUR: 49 mg/dL — AB (ref 70–139)
Glucose, 1 hour: 100 mg/dL (ref 70–170)
Glucose, Fasting: 59 mg/dL — ABNORMAL LOW (ref 70–99)

## 2013-11-11 LAB — HIV ANTIBODY (ROUTINE TESTING W REFLEX): HIV 1&2 Ab, 4th Generation: NONREACTIVE

## 2013-11-11 LAB — RPR

## 2013-11-11 NOTE — Progress Notes (Signed)
Subjective:    Gayleen Orembigail J Eastman is a 23 y.o. female being seen today for her obstetrical visit. She is at 5468w4d gestation. Patient reports: no complaints . Fetal movement: normal.  Problem List Items Addressed This Visit   None    Visit Diagnoses   Encounter for supervision of other normal pregnancy in second trimester    -  Primary    Relevant Orders       Glucose Tolerance, 2 Hours w/1 Hour       CBC       HIV antibody       RPR      Patient Active Problem List   Diagnosis Date Noted  . Unsure of LMP (last menstrual period) as reason for ultrasound scan 07/18/2013   Objective:    BP 103/67  Pulse 61  Temp(Src) 97.8 F (36.6 C)  Wt 167 lb (75.751 kg)  LMP 05/16/2013 FHT: 150 BPM  Uterine Size: size equals dates     Assessment:    Pregnancy @ 6868w4d    Plan:    OBGCT: ordered.  Labs, problem list reviewed and updated 2 hr GTT planned Follow up in 2 weeks.

## 2013-11-14 LAB — POCT URINALYSIS DIPSTICK
BILIRUBIN UA: NEGATIVE
Blood, UA: NEGATIVE
Glucose, UA: NEGATIVE
KETONES UA: NEGATIVE
LEUKOCYTES UA: NEGATIVE
NITRITE UA: NEGATIVE
PH UA: 7
PROTEIN UA: NEGATIVE
Spec Grav, UA: <=1.005
Urobilinogen, UA: NEGATIVE

## 2013-11-14 NOTE — Addendum Note (Signed)
Addended by: Odessa FlemingBOHNE, Uzoma Vivona M on: 11/14/2013 06:20 PM   Modules accepted: Orders

## 2013-11-24 ENCOUNTER — Inpatient Hospital Stay (HOSPITAL_COMMUNITY): Admission: AD | Admit: 2013-11-24 | Payer: Medicaid Other | Source: Ambulatory Visit | Admitting: Obstetrics

## 2013-11-24 ENCOUNTER — Encounter: Payer: Medicaid Other | Admitting: Obstetrics

## 2013-12-05 ENCOUNTER — Ambulatory Visit (INDEPENDENT_AMBULATORY_CARE_PROVIDER_SITE_OTHER): Payer: Medicaid Other | Admitting: Obstetrics

## 2013-12-05 ENCOUNTER — Other Ambulatory Visit: Payer: Self-pay | Admitting: Obstetrics

## 2013-12-05 VITALS — BP 106/68 | HR 67 | Temp 97.4°F | Wt 170.0 lb

## 2013-12-05 DIAGNOSIS — O99119 Other diseases of the blood and blood-forming organs and certain disorders involving the immune mechanism complicating pregnancy, unspecified trimester: Secondary | ICD-10-CM

## 2013-12-05 DIAGNOSIS — O99112 Other diseases of the blood and blood-forming organs and certain disorders involving the immune mechanism complicating pregnancy, second trimester: Secondary | ICD-10-CM

## 2013-12-05 DIAGNOSIS — Z3689 Encounter for other specified antenatal screening: Secondary | ICD-10-CM

## 2013-12-05 DIAGNOSIS — D696 Thrombocytopenia, unspecified: Secondary | ICD-10-CM

## 2013-12-05 DIAGNOSIS — D689 Coagulation defect, unspecified: Secondary | ICD-10-CM

## 2013-12-05 DIAGNOSIS — O0990 Supervision of high risk pregnancy, unspecified, unspecified trimester: Secondary | ICD-10-CM

## 2013-12-05 DIAGNOSIS — Z3480 Encounter for supervision of other normal pregnancy, unspecified trimester: Secondary | ICD-10-CM

## 2013-12-05 DIAGNOSIS — K219 Gastro-esophageal reflux disease without esophagitis: Secondary | ICD-10-CM

## 2013-12-05 DIAGNOSIS — O99113 Other diseases of the blood and blood-forming organs and certain disorders involving the immune mechanism complicating pregnancy, third trimester: Secondary | ICD-10-CM

## 2013-12-05 DIAGNOSIS — Z3483 Encounter for supervision of other normal pregnancy, third trimester: Secondary | ICD-10-CM

## 2013-12-05 LAB — POCT URINALYSIS DIPSTICK
Bilirubin, UA: NEGATIVE
GLUCOSE UA: NEGATIVE
Ketones, UA: NEGATIVE
LEUKOCYTES UA: NEGATIVE
NITRITE UA: NEGATIVE
PH UA: 7
Protein, UA: NEGATIVE
RBC UA: NEGATIVE
Spec Grav, UA: <=1.005
UROBILINOGEN UA: NEGATIVE

## 2013-12-05 MED ORDER — OMEPRAZOLE 20 MG PO CPDR: 20.0000 mg | DELAYED_RELEASE_CAPSULE | Freq: Two times a day (BID) | ORAL | Status: DC

## 2013-12-08 ENCOUNTER — Encounter: Payer: Self-pay | Admitting: Obstetrics

## 2013-12-08 NOTE — Progress Notes (Signed)
Subjective:    Brittany Vargas is a 23 y.o. female being seen today for her obstetrical visit. She is at 4127w3d gestation. Patient reports no complaints. Fetal movement: normal.  Problem List Items Addressed This Visit   Thrombocytopenia complicating pregnancy   Relevant Orders      AMB Referral to Maternal Fetal Medicine (MFM)    Other Visit Diagnoses   Unspecified high-risk pregnancy    -  Primary    Encounter for supervision of other normal pregnancy in third trimester        Relevant Orders       POCT urinalysis dipstick (Completed)    GERD without esophagitis        Relevant Medications       omeprazole (PRILOSEC) capsule      Patient Active Problem List   Diagnosis Date Noted  . Thrombocytopenia complicating pregnancy 12/05/2013  . Unsure of LMP (last menstrual period) as reason for ultrasound scan 07/18/2013   Objective:    BP 106/68  Pulse 67  Temp(Src) 97.4 F (36.3 C)  Wt 170 lb (77.111 kg)  LMP 05/16/2013 FHT:  150 BPM  Uterine Size: size equals dates  Presentation: unsure     Assessment:    Pregnancy @ 3127w3d weeks   Plan:     labs reviewed, problem list updated Consent signed. GBS sent TDAP offered  Rhogam given for RH negative Pediatrician: discussed. Infant feeding: plans to breastfeed. Maternity leave: not discussed. Cigarette smoking: never smoked. Orders Placed This Encounter  Procedures  . AMB Referral to Maternal Fetal Medicine (MFM)    Referral Priority:  Routine    Referral Type:  Consultation    Number of Visits Requested:  1  . POCT urinalysis dipstick   Meds ordered this encounter  Medications  . omeprazole (PRILOSEC) 20 MG capsule    Sig: Take 1 capsule (20 mg total) by mouth 2 (two) times daily before a meal.    Dispense:  60 capsule    Refill:  5   Follow up in 2 Weeks.

## 2013-12-13 ENCOUNTER — Other Ambulatory Visit (HOSPITAL_COMMUNITY): Payer: Medicaid Other

## 2013-12-13 ENCOUNTER — Ambulatory Visit (HOSPITAL_COMMUNITY)
Admission: RE | Admit: 2013-12-13 | Discharge: 2013-12-13 | Disposition: A | Discharge status: Home / Self Care | Payer: Medicaid Other | Source: Ambulatory Visit | Attending: Obstetrics | Admitting: Obstetrics

## 2013-12-13 DIAGNOSIS — O99112 Other diseases of the blood and blood-forming organs and certain disorders involving the immune mechanism complicating pregnancy, second trimester: Secondary | ICD-10-CM

## 2013-12-13 DIAGNOSIS — Z3689 Encounter for other specified antenatal screening: Secondary | ICD-10-CM

## 2013-12-13 DIAGNOSIS — D696 Thrombocytopenia, unspecified: Secondary | ICD-10-CM | POA: Diagnosis not present

## 2013-12-13 DIAGNOSIS — O99119 Other diseases of the blood and blood-forming organs and certain disorders involving the immune mechanism complicating pregnancy, unspecified trimester: Secondary | ICD-10-CM | POA: Diagnosis present

## 2013-12-13 DIAGNOSIS — D689 Coagulation defect, unspecified: Secondary | ICD-10-CM | POA: Insufficient documentation

## 2013-12-13 NOTE — Progress Notes (Signed)
MATERNAL FETAL MEDICINE CONSULT  Patient Name: Brittany Vargas Medical Record Number:  [id number] Date of Birth: 30-Sep-1990 Requesting Physician Name:  Brock Bad, MD Date of Service: 12/13/2013  Chief Complaint Thrombocytopenia  History of Present Illness Brittany Vargas was seen today secondary to thrombocytopenia at the request of Brock Bad, MD.  The patient is a 23 y.o. [id number] [redacted]w[redacted]d with an EDD of 02/20/2014, by Last Menstrual Period dating method.  Ms. Dillavou was first diagnosed with thrombocytopenia at age 31 when a CBC was performed during a workup for easy bruising.  She was prescribed a medication to treat the thrombocytopenia at that time, but does not remember the name and did not take it.  She reports nosebleeds recently, but not other past history of bleeding issues.  She has had three terminations of pregnancy via D&C and a successful vaginal delivery without any bleeding issues.  Her most recent platelet count was 137.  She is otherwise healthy and has no other issues with this pregnancy.  Review of Systems Pertinent items are noted in HPI.  Patient History OB History  Gravida Para Term Preterm AB SAB TAB Ectopic Multiple Living  4 1 1  2  2   1     # Outcome Date GA Lbr Len/2nd Weight Sex Delivery Anes PTL Lv  4 CUR           3 TRM 01/10/12 [redacted]w[redacted]d 13:01 / 01:06  M SVD EPI  Y  2 TAB 2010          1 TAB 2010              Past Medical History  Diagnosis Date  . No pertinent past medical history     Past Surgical History  Procedure Laterality Date  . No past surgeries      History   Social History  . Marital Status: Single    Spouse Name: N/A    Number of Children: N/A  . Years of Education: N/A   Social History Main Topics  . Smoking status: Never Smoker   . Smokeless tobacco: Never Used  . Alcohol Use: No  . Drug Use: No  . Sexual Activity: Yes    Birth Control/ Protection: None   Other Topics Concern  . Not on file   Social History  Narrative  . No narrative on file    Family History The father of the baby's aunt has birth defects, but Ms. Marchiano is not aware of the specific birth defects or whether the FOB's aunt carries a specific diagnosis.  There is no other family history of mental retardation, birth defects, or genetic diseases.  Physical Examination Vitals - BP 106/69, Pulse 82, Weight 167 lbs General - no acute distress Abdomen - soft, non-tender, gravid Extremities - no edema  Assessment and Recommendations 1.  Thrombocytopenia.  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  Alternative diagnoses include preeclampsia/HELLP and TPP.  Ms. Snodgrass has been normotensive throughout pregnancy and never had more than trace proteinuria during her prenatal visits.  Thus, preeclampsia is exceedingly unlikely.  As Ms. Holik's thrombocytopenia is mild and she does not have any renal or neurologic symptoms TPP is also very unlikely.  Ms. Spizzirri very mild thrombocytopenia is unlikely to have significant impact on the remainder of her pregnancy, including labor and delivery.  Major bleeding associated with surgery does not become a problem until the platelet count falls  below 50, and spontaneous bleeding does not generally become a problem until platelet counts fall below 20.  However, epidural hematoma is associated with epidural placement in the setting of more mild thrombocytopenia.  Epidural placement is generally considered safe so long as platelet counts remain above 80-90 (exact cut-offs vary depending on the specific Anesthesiologist providing patient care).  I recommend rechecking a CBC every two weeks or at the time of labor to determine if neuraxial analgesia is safe.  If her platelet cout is below 80 she could be treated with prednisone to potentially increase her platelet count to allow for safe neuraxial analgesia.  If Ms. Genice RougeBrito desires more  information about labor analgesia/anesthesia options a consult with Anesthesiology may be helpful.  [facility].  Please do not hesitate to contact us with questions.   Rema FendtNITSCHE,Nishant Schrecengost, MD

## 2013-12-19 ENCOUNTER — Encounter: Payer: Medicaid Other | Admitting: Obstetrics

## 2014-01-03 ENCOUNTER — Ambulatory Visit (INDEPENDENT_AMBULATORY_CARE_PROVIDER_SITE_OTHER): Payer: Medicaid Other | Admitting: Obstetrics

## 2014-01-03 ENCOUNTER — Encounter: Payer: Self-pay | Admitting: Obstetrics

## 2014-01-03 VITALS — Temp 98.1°F

## 2014-01-03 DIAGNOSIS — Z3480 Encounter for supervision of other normal pregnancy, unspecified trimester: Secondary | ICD-10-CM

## 2014-01-03 DIAGNOSIS — Z3483 Encounter for supervision of other normal pregnancy, third trimester: Secondary | ICD-10-CM

## 2014-01-04 LAB — POCT URINALYSIS DIPSTICK
Bilirubin, UA: NEGATIVE
Glucose, UA: NEGATIVE
Ketones, UA: NEGATIVE
Leukocytes, UA: NEGATIVE
NITRITE UA: NEGATIVE
RBC UA: NEGATIVE
SPEC GRAV UA: 1.015
UROBILINOGEN UA: NEGATIVE
pH, UA: 6.5

## 2014-01-04 NOTE — Addendum Note (Signed)
Addended by: Odessa FlemingBOHNE, Morene Cecilio M on: 01/04/2014 09:32 AM   Modules accepted: Orders

## 2014-01-17 ENCOUNTER — Encounter: Payer: Medicaid Other | Admitting: Obstetrics

## 2014-01-18 ENCOUNTER — Ambulatory Visit (INDEPENDENT_AMBULATORY_CARE_PROVIDER_SITE_OTHER): Payer: Medicaid Other | Admitting: Obstetrics

## 2014-01-18 VITALS — BP 106/73 | HR 88 | Temp 97.8°F | Wt 171.0 lb

## 2014-01-18 DIAGNOSIS — Z3483 Encounter for supervision of other normal pregnancy, third trimester: Secondary | ICD-10-CM

## 2014-01-18 DIAGNOSIS — Z3480 Encounter for supervision of other normal pregnancy, unspecified trimester: Secondary | ICD-10-CM

## 2014-01-18 LAB — POCT URINALYSIS DIPSTICK
Bilirubin, UA: NEGATIVE
Glucose, UA: NEGATIVE
KETONES UA: NEGATIVE
Leukocytes, UA: NEGATIVE
Nitrite, UA: NEGATIVE
PH UA: 7
PROTEIN UA: NEGATIVE
RBC UA: NEGATIVE
Urobilinogen, UA: NEGATIVE

## 2014-01-19 ENCOUNTER — Encounter: Payer: Self-pay | Admitting: Obstetrics

## 2014-01-19 NOTE — Progress Notes (Signed)
Subjective:    Brittany Vargas is a 23 y.o. female being seen today for her obstetrical visit. She is at [redacted]w[redacted]d gestation. Patient reports no complaints. Fetal movement: normal.  Problem List Items Addressed This Visit   None    Visit Diagnoses   Encounter for supervision of other normal pregnancy in third trimester    -  Primary    Relevant Orders       POCT urinalysis dipstick (Completed)      Patient Active Problem List   Diagnosis Date Noted  . Thrombocytopenia complicating pregnancy 12/05/2013  . Unsure of LMP (last menstrual period) as reason for ultrasound scan 07/18/2013   Objective:    BP 106/73  Pulse 88  Temp(Src) 97.8 F (36.6 C)  Wt 171 lb (77.565 kg)  LMP 05/16/2013 FHT:  150 BPM  Uterine Size: size equals dates  Presentation: unsure     Assessment:    Pregnancy @ [redacted]w[redacted]d weeks   Plan:     labs reviewed, problem list updated Consent signed. GBS sent TDAP offered  Rhogam given for RH negative Pediatrician: discussed. Infant feeding: plans to breastfeed. Maternity leave: discussed. Cigarette smoking: never smoked. Orders Placed This Encounter  Procedures  . POCT urinalysis dipstick   No orders of the defined types were placed in this encounter.   Follow up in 1 Week.

## 2014-01-24 ENCOUNTER — Encounter: Payer: Medicaid Other | Admitting: Obstetrics

## 2014-01-24 ENCOUNTER — Ambulatory Visit (INDEPENDENT_AMBULATORY_CARE_PROVIDER_SITE_OTHER): Payer: Medicaid Other | Admitting: Obstetrics

## 2014-01-24 VITALS — BP 106/54 | HR 111 | Temp 98.1°F

## 2014-01-24 DIAGNOSIS — Z3480 Encounter for supervision of other normal pregnancy, unspecified trimester: Secondary | ICD-10-CM

## 2014-01-24 DIAGNOSIS — Z3483 Encounter for supervision of other normal pregnancy, third trimester: Secondary | ICD-10-CM

## 2014-01-24 LAB — POCT URINALYSIS DIPSTICK
BILIRUBIN UA: NEGATIVE
Glucose, UA: NEGATIVE
KETONES UA: NEGATIVE
LEUKOCYTES UA: NEGATIVE
Nitrite, UA: NEGATIVE
PH UA: 7
Protein, UA: NEGATIVE
RBC UA: NEGATIVE
SPEC GRAV UA: 1.01
Urobilinogen, UA: NEGATIVE

## 2014-01-24 NOTE — Progress Notes (Signed)
Patient is 36 weeks and 1 day along and was suppose to be seen by Dr. Clearance Coots today. Dr. Clearance Coots had to be out of the office. NST preformed. Patient to see Dr. Clearance Coots on Friday.

## 2014-01-25 NOTE — Progress Notes (Signed)
NST done.  Patient not seen by physician. 

## 2014-01-27 ENCOUNTER — Encounter: Payer: Medicaid Other | Admitting: Obstetrics

## 2014-01-27 ENCOUNTER — Ambulatory Visit (INDEPENDENT_AMBULATORY_CARE_PROVIDER_SITE_OTHER): Payer: Medicaid Other | Admitting: Obstetrics

## 2014-01-27 VITALS — BP 103/68 | HR 72 | Temp 98.4°F | Wt 171.0 lb

## 2014-01-27 DIAGNOSIS — Z3480 Encounter for supervision of other normal pregnancy, unspecified trimester: Secondary | ICD-10-CM

## 2014-01-27 DIAGNOSIS — Z3483 Encounter for supervision of other normal pregnancy, third trimester: Secondary | ICD-10-CM

## 2014-01-27 LAB — POCT URINALYSIS DIPSTICK
Bilirubin, UA: NEGATIVE
Blood, UA: NEGATIVE
GLUCOSE UA: NEGATIVE
Ketones, UA: NEGATIVE
Nitrite, UA: NEGATIVE
Protein, UA: NEGATIVE
SPEC GRAV UA: 1.01
UROBILINOGEN UA: NEGATIVE
pH, UA: 7.5

## 2014-01-28 ENCOUNTER — Encounter: Payer: Self-pay | Admitting: Obstetrics

## 2014-01-29 NOTE — Progress Notes (Signed)
Subjective:    Brittany Vargas is a 23 y.o. female being seen today for her obstetrical visit. She is at [redacted]w[redacted]d gestation. Patient reports no complaints. Fetal movement: normal.  Problem List Items Addressed This Visit   None    Visit Diagnoses   Encounter for supervision of other normal pregnancy in third trimester    -  Primary    Relevant Orders       POCT urinalysis dipstick (Completed)      Patient Active Problem List   Diagnosis Date Noted  . Thrombocytopenia complicating pregnancy 12/05/2013  . Unsure of LMP (last menstrual period) as reason for ultrasound scan 07/18/2013   Objective:    BP 103/68  Pulse 72  Temp(Src) 98.4 F (36.9 C)  Wt 171 lb (77.565 kg)  LMP 05/16/2013 FHT:  150 BPM  Uterine Size: size equals dates  Presentation: unsure     Assessment:    Pregnancy @ [redacted]w[redacted]d weeks   Plan:     labs reviewed, problem list updated Consent signed. GBS sent TDAP offered  Rhogam given for RH negative Pediatrician: discussed. Infant feeding: plans to breastfeed. Maternity leave: discussed. Cigarette smoking: never smoked. Orders Placed This Encounter  Procedures  . POCT urinalysis dipstick   No orders of the defined types were placed in this encounter.   Follow up in 1 Week.

## 2014-02-06 ENCOUNTER — Ambulatory Visit (INDEPENDENT_AMBULATORY_CARE_PROVIDER_SITE_OTHER): Payer: Medicaid Other | Admitting: Obstetrics

## 2014-02-06 ENCOUNTER — Encounter: Payer: Self-pay | Admitting: Obstetrics

## 2014-02-06 VITALS — BP 92/51 | HR 86 | Temp 98.4°F | Wt 172.0 lb

## 2014-02-06 DIAGNOSIS — Z3480 Encounter for supervision of other normal pregnancy, unspecified trimester: Secondary | ICD-10-CM

## 2014-02-06 DIAGNOSIS — Z3483 Encounter for supervision of other normal pregnancy, third trimester: Secondary | ICD-10-CM

## 2014-02-06 LAB — POCT URINALYSIS DIPSTICK
BILIRUBIN UA: NEGATIVE
Blood, UA: NEGATIVE
Glucose, UA: NEGATIVE
Ketones, UA: NEGATIVE
Leukocytes, UA: NEGATIVE
Nitrite, UA: NEGATIVE
PROTEIN UA: NEGATIVE
Urobilinogen, UA: NEGATIVE
pH, UA: 8

## 2014-02-06 NOTE — Progress Notes (Signed)
Subjective:    Brittany Vargas is a 23 y.o. female being seen today for her obstetrical visit. She is at [redacted]w[redacted]d gestation. Patient reports no complaints. Fetal movement: normal.  Problem List Items Addressed This Visit   None    Visit Diagnoses   Encounter for supervision of other normal pregnancy in third trimester    -  Primary    Relevant Orders       POCT urinalysis dipstick (Completed)      Patient Active Problem List   Diagnosis Date Noted  . Thrombocytopenia complicating pregnancy 12/05/2013  . Unsure of LMP (last menstrual period) as reason for ultrasound scan 07/18/2013    Objective:    BP 92/51  Pulse 86  Temp(Src) 98.4 F (36.9 C)  Wt 172 lb (78.019 kg)  LMP 05/16/2013 FHT: 150 BPM  Uterine Size: size equals dates  Presentations: unsure  Pelvic Exam: Deferred    Assessment:    Pregnancy @ [redacted]w[redacted]d weeks   Plan:   Plans for delivery: Vaginal anticipated; labs reviewed; problem list updated Counseling: Consent signed. Infant feeding: plans to breastfeed. Cigarette smoking: never smoked. L&D discussion: symptoms of labor, discussed when to call, discussed what number to call, anesthetic/analgesic options reviewed and delivering clinician:  plans Physician. Postpartum supports and preparation: circumcision discussed and contraception plans discussed.  Follow up in 1 Week.

## 2014-02-13 ENCOUNTER — Encounter: Payer: Medicaid Other | Admitting: Obstetrics

## 2014-02-23 ENCOUNTER — Ambulatory Visit (INDEPENDENT_AMBULATORY_CARE_PROVIDER_SITE_OTHER): Payer: Medicaid Other | Admitting: Obstetrics

## 2014-02-23 ENCOUNTER — Encounter: Payer: Self-pay | Admitting: Obstetrics

## 2014-02-23 VITALS — BP 110/72 | HR 81 | Temp 98.0°F | Wt 175.0 lb

## 2014-02-23 DIAGNOSIS — O99113 Other diseases of the blood and blood-forming organs and certain disorders involving the immune mechanism complicating pregnancy, third trimester: Secondary | ICD-10-CM

## 2014-02-23 DIAGNOSIS — Z3483 Encounter for supervision of other normal pregnancy, third trimester: Secondary | ICD-10-CM

## 2014-02-23 DIAGNOSIS — D696 Thrombocytopenia, unspecified: Secondary | ICD-10-CM

## 2014-02-23 LAB — POCT URINALYSIS DIPSTICK
Blood, UA: NEGATIVE
GLUCOSE UA: NEGATIVE
Ketones, UA: NEGATIVE
LEUKOCYTES UA: NEGATIVE
Nitrite, UA: NEGATIVE
PH UA: 7
Spec Grav, UA: <=1.005

## 2014-02-23 LAB — CBC
HEMATOCRIT: 41.7 % (ref 36.0–46.0)
HEMOGLOBIN: 14.5 g/dL (ref 12.0–15.0)
MCH: 34.3 pg — ABNORMAL HIGH (ref 26.0–34.0)
MCHC: 34.8 g/dL (ref 30.0–36.0)
MCV: 98.6 fL (ref 78.0–100.0)
Platelets: 116 10*3/uL — ABNORMAL LOW (ref 150–400)
RBC: 4.23 MIL/uL (ref 3.87–5.11)
RDW: 13.6 % (ref 11.5–15.5)
WBC: 7.4 10*3/uL (ref 4.0–10.5)

## 2014-02-23 NOTE — Addendum Note (Signed)
Addended by: Elby BeckPAUL, Thurl Boen F on: 02/23/2014 12:10 PM   Modules accepted: Orders

## 2014-02-23 NOTE — Progress Notes (Signed)
Subjective:    Brittany Vargas is a 23 y.o. female being seen today for her obstetrical visit. She is at 345w3d gestation. Patient reports no complaints. Fetal movement: normal.  Problem List Items Addressed This Visit   Thrombocytopenia complicating pregnancy   Relevant Orders      CBC    Other Visit Diagnoses   Encounter for supervision of other normal pregnancy in third trimester    -  Primary    Relevant Orders       POCT urinalysis dipstick (Completed)       Fetal non-stress test      Patient Active Problem List   Diagnosis Date Noted  . Thrombocytopenia complicating pregnancy 12/05/2013  . Unsure of LMP (last menstrual period) as reason for ultrasound scan 07/18/2013    Objective:    BP 110/72  Pulse 81  Temp(Src) 98 F (36.7 C)  Wt 175 lb (79.379 kg)  LMP 05/16/2013 FHT:  150 BPM  Uterine Size: size equals dates  Presentation: cephalic       Assessment:    Pregnancy @ 5245w3d  weeks   Thrombocytopenia.  Plan:   Check platelets today. Postdates management: discussed fetal surveillance and induction, discussed fetal movement, NST reactive, biophysical profile ordered. Induction: scheduled for 02-28-14, written information given.  Follow up in postpartum period..Marland Kitchen

## 2014-02-24 LAB — STREP B DNA PROBE: GBSP: DETECTED

## 2014-02-26 ENCOUNTER — Encounter: Payer: Self-pay | Admitting: Obstetrics & Gynecology

## 2014-02-26 DIAGNOSIS — O99820 Streptococcus B carrier state complicating pregnancy: Secondary | ICD-10-CM | POA: Insufficient documentation

## 2014-02-27 ENCOUNTER — Telehealth (HOSPITAL_COMMUNITY): Payer: Self-pay | Admitting: *Deleted

## 2014-02-27 ENCOUNTER — Encounter (HOSPITAL_COMMUNITY): Payer: Self-pay | Admitting: *Deleted

## 2014-02-27 NOTE — Telephone Encounter (Signed)
Preadmission screen  

## 2014-02-28 ENCOUNTER — Encounter (HOSPITAL_COMMUNITY): Payer: Medicaid Other | Admitting: Anesthesiology

## 2014-02-28 ENCOUNTER — Inpatient Hospital Stay (HOSPITAL_COMMUNITY): Payer: Medicaid Other | Admitting: Anesthesiology

## 2014-02-28 ENCOUNTER — Inpatient Hospital Stay (HOSPITAL_COMMUNITY)
Admission: RE | Admit: 2014-02-28 | Discharge: 2014-03-02 | DRG: 775 | Disposition: A | Discharge status: Home / Self Care | Payer: Medicaid Other | Source: Ambulatory Visit | Attending: Obstetrics | Admitting: Obstetrics

## 2014-02-28 ENCOUNTER — Encounter (HOSPITAL_COMMUNITY): Payer: Self-pay

## 2014-02-28 DIAGNOSIS — O480 Post-term pregnancy: Principal | ICD-10-CM | POA: Diagnosis present

## 2014-02-28 DIAGNOSIS — Z8759 Personal history of other complications of pregnancy, childbirth and the puerperium: Secondary | ICD-10-CM

## 2014-02-28 DIAGNOSIS — O99824 Streptococcus B carrier state complicating childbirth: Secondary | ICD-10-CM | POA: Diagnosis present

## 2014-02-28 DIAGNOSIS — Z3A41 41 weeks gestation of pregnancy: Secondary | ICD-10-CM | POA: Diagnosis present

## 2014-02-28 LAB — CBC
HEMATOCRIT: 41.5 % (ref 36.0–46.0)
HEMATOCRIT: 42.6 % (ref 36.0–46.0)
HEMATOCRIT: 41.9 % (ref 36.0–46.0)
Hemoglobin: 14.8 g/dL (ref 12.0–15.0)
Hemoglobin: 15.2 g/dL — ABNORMAL HIGH (ref 12.0–15.0)
Hemoglobin: 15.2 g/dL — ABNORMAL HIGH (ref 12.0–15.0)
MCH: 34.8 pg — ABNORMAL HIGH (ref 26.0–34.0)
MCH: 35.1 pg — ABNORMAL HIGH (ref 26.0–34.0)
MCH: 35.3 pg — ABNORMAL HIGH (ref 26.0–34.0)
MCHC: 35.7 g/dL (ref 30.0–36.0)
MCHC: 35.7 g/dL (ref 30.0–36.0)
MCHC: 36.3 g/dL — AB (ref 30.0–36.0)
MCV: 97.6 fL (ref 78.0–100.0)
MCV: 98.4 fL (ref 78.0–100.0)
MCV: 97.4 fL (ref 78.0–100.0)
Platelets: 109 10*3/uL — ABNORMAL LOW (ref 150–400)
Platelets: 106 10*3/uL — ABNORMAL LOW (ref 150–400)
Platelets: 96 10*3/uL — ABNORMAL LOW (ref 150–400)
RBC: 4.25 MIL/uL (ref 3.87–5.11)
RBC: 4.33 MIL/uL (ref 3.87–5.11)
RBC: 4.3 MIL/uL (ref 3.87–5.11)
RDW: 13.4 % (ref 11.5–15.5)
RDW: 13.4 % (ref 11.5–15.5)
RDW: 13.5 % (ref 11.5–15.5)
WBC: 7.7 10*3/uL (ref 4.0–10.5)
WBC: 10.4 10*3/uL (ref 4.0–10.5)
WBC: 11.6 10*3/uL — ABNORMAL HIGH (ref 4.0–10.5)

## 2014-02-28 LAB — TYPE AND SCREEN
ABO/RH(D): O POS
Antibody Screen: NEGATIVE

## 2014-02-28 LAB — RPR

## 2014-02-28 MED ORDER — MISOPROSTOL 25 MCG QUARTER TABLET
25.0000 ug | ORAL_TABLET | ORAL | Status: DC | PRN
  Administered 2014-02-28: 25 ug via VAGINAL
  Filled 2014-02-28: qty 1
  Filled 2014-02-28: qty 0.25

## 2014-02-28 MED ORDER — PRENATAL MULTIVITAMIN CH
1.0000 | ORAL_TABLET | Freq: Every day | ORAL | Status: DC
  Administered 2014-03-01 – 2014-03-02 (×2): 1 via ORAL
  Filled 2014-02-28 (×2): qty 1

## 2014-02-28 MED ORDER — METHYLERGONOVINE MALEATE 0.2 MG/ML IJ SOLN: 0.2000 mg | Freq: Three times a day (TID) | INTRAMUSCULAR | Status: DC

## 2014-02-28 MED ORDER — LACTATED RINGERS IV SOLN
500.0000 mL | Freq: Once | INTRAVENOUS | Status: AC
  Administered 2014-02-28: 500 mL via INTRAVENOUS

## 2014-02-28 MED ORDER — LACTATED RINGERS IV SOLN
INTRAVENOUS | Status: DC
  Administered 2014-02-28 (×2): via INTRAVENOUS

## 2014-02-28 MED ORDER — EPHEDRINE 5 MG/ML INJ
10.0000 mg | INTRAVENOUS | Status: DC | PRN
  Filled 2014-02-28: qty 2

## 2014-02-28 MED ORDER — BENZOCAINE-MENTHOL 20-0.5 % EX AERO
1.0000 | INHALATION_SPRAY | CUTANEOUS | Status: DC | PRN
Start: 2014-02-28 — End: 2014-03-02
  Administered 2014-03-01: 1 via TOPICAL
  Filled 2014-02-28: qty 56

## 2014-02-28 MED ORDER — PENICILLIN G POTASSIUM 5000000 UNITS IJ SOLR
5.0000 10*6.[IU] | Freq: Once | INTRAVENOUS | Status: DC
  Filled 2014-02-28: qty 5

## 2014-02-28 MED ORDER — LIDOCAINE HCL (PF) 1 % IJ SOLN
30.0000 mL | INTRAMUSCULAR | Status: DC | PRN
  Administered 2014-02-28: 30 mL via SUBCUTANEOUS
  Filled 2014-02-28: qty 30

## 2014-02-28 MED ORDER — OXYTOCIN 40 UNITS IN LACTATED RINGERS INFUSION - SIMPLE MED: 62.5000 mL/h | INTRAVENOUS | Status: DC | PRN

## 2014-02-28 MED ORDER — FENTANYL 2.5 MCG/ML BUPIVACAINE 1/10 % EPIDURAL INFUSION (WH - ANES)
14.0000 mL/h | INTRAMUSCULAR | Status: DC | PRN
  Administered 2014-02-28: 14 mL/h via EPIDURAL

## 2014-02-28 MED ORDER — DIPHENHYDRAMINE HCL 50 MG/ML IJ SOLN: 12.5000 mg | INTRAMUSCULAR | Status: DC | PRN

## 2014-02-28 MED ORDER — PHENYLEPHRINE 40 MCG/ML (10ML) SYRINGE FOR IV PUSH (FOR BLOOD PRESSURE SUPPORT)
80.0000 ug | PREFILLED_SYRINGE | INTRAVENOUS | Status: DC | PRN
  Filled 2014-02-28: qty 2

## 2014-02-28 MED ORDER — DIBUCAINE 1 % RE OINT: 1.0000 "application " | TOPICAL_OINTMENT | RECTAL | Status: DC | PRN

## 2014-02-28 MED ORDER — LIDOCAINE HCL (PF) 1 % IJ SOLN: 30.0000 mL | INTRAMUSCULAR | Status: DC | PRN

## 2014-02-28 MED ORDER — OXYTOCIN 40 UNITS IN LACTATED RINGERS INFUSION - SIMPLE MED
62.5000 mL/h | INTRAVENOUS | Status: DC
  Administered 2014-02-28: 62.5 mL/h via INTRAVENOUS
  Filled 2014-02-28: qty 1000

## 2014-02-28 MED ORDER — PHENYLEPHRINE 40 MCG/ML (10ML) SYRINGE FOR IV PUSH (FOR BLOOD PRESSURE SUPPORT)
PREFILLED_SYRINGE | INTRAVENOUS | Status: AC
  Filled 2014-02-28: qty 10

## 2014-02-28 MED ORDER — TERBUTALINE SULFATE 1 MG/ML IJ SOLN: 0.2500 mg | Freq: Once | INTRAMUSCULAR | Status: DC | PRN

## 2014-02-28 MED ORDER — PENICILLIN G POTASSIUM 5000000 UNITS IJ SOLR
2.5000 10*6.[IU] | INTRAVENOUS | Status: DC
  Filled 2014-02-28 (×4): qty 2.5

## 2014-02-28 MED ORDER — NALBUPHINE HCL 10 MG/ML IJ SOLN
10.0000 mg | Freq: Four times a day (QID) | INTRAMUSCULAR | Status: DC | PRN
  Filled 2014-02-28: qty 1

## 2014-02-28 MED ORDER — FLEET ENEMA 7-19 GM/118ML RE ENEM: 1.0000 | ENEMA | RECTAL | Status: DC | PRN

## 2014-02-28 MED ORDER — TETANUS-DIPHTH-ACELL PERTUSSIS 5-2.5-18.5 LF-MCG/0.5 IM SUSP: 0.5000 mL | Freq: Once | INTRAMUSCULAR | Status: DC

## 2014-02-28 MED ORDER — OXYTOCIN 40 UNITS IN LACTATED RINGERS INFUSION - SIMPLE MED
62.5000 mL/h | INTRAVENOUS | Status: DC | PRN
  Administered 2014-03-01: 62.5 mL/h via INTRAVENOUS
  Filled 2014-02-28: qty 1000

## 2014-02-28 MED ORDER — OXYCODONE-ACETAMINOPHEN 5-325 MG PO TABS: 2.0000 | ORAL_TABLET | ORAL | Status: DC | PRN

## 2014-02-28 MED ORDER — PENICILLIN G POTASSIUM 5000000 UNITS IJ SOLR
5.0000 10*6.[IU] | Freq: Once | INTRAMUSCULAR | Status: DC
  Filled 2014-02-28: qty 5

## 2014-02-28 MED ORDER — OXYTOCIN BOLUS FROM INFUSION: 500.0000 mL | INTRAVENOUS | Status: DC

## 2014-02-28 MED ORDER — ZOLPIDEM TARTRATE 5 MG PO TABS: 5.0000 mg | ORAL_TABLET | Freq: Every evening | ORAL | Status: DC | PRN

## 2014-02-28 MED ORDER — LACTATED RINGERS IV SOLN: INTRAVENOUS | Status: DC

## 2014-02-28 MED ORDER — CITRIC ACID-SODIUM CITRATE 334-500 MG/5ML PO SOLN: 30.0000 mL | ORAL | Status: DC | PRN

## 2014-02-28 MED ORDER — METHYLERGONOVINE MALEATE 0.2 MG/ML IJ SOLN
INTRAMUSCULAR | Status: AC
  Administered 2014-02-28: 0.2 mg
  Filled 2014-02-28: qty 1

## 2014-02-28 MED ORDER — LACTATED RINGERS IV SOLN: 500.0000 mL | INTRAVENOUS | Status: DC | PRN

## 2014-02-28 MED ORDER — ACETAMINOPHEN 325 MG PO TABS: 650.0000 mg | ORAL_TABLET | ORAL | Status: DC | PRN

## 2014-02-28 MED ORDER — ONDANSETRON HCL 4 MG/2ML IJ SOLN
4.0000 mg | INTRAMUSCULAR | Status: DC | PRN
Start: 2014-02-28 — End: 2014-03-02

## 2014-02-28 MED ORDER — METHYLERGONOVINE MALEATE 0.2 MG PO TABS: 0.2000 mg | ORAL_TABLET | Freq: Three times a day (TID) | ORAL | Status: DC

## 2014-02-28 MED ORDER — NALBUPHINE HCL 10 MG/ML IJ SOLN
10.0000 mg | INTRAMUSCULAR | Status: DC | PRN
  Administered 2014-02-28: 10 mg via INTRAVENOUS
  Filled 2014-02-28: qty 1

## 2014-02-28 MED ORDER — SENNOSIDES-DOCUSATE SODIUM 8.6-50 MG PO TABS
2.0000 | ORAL_TABLET | ORAL | Status: DC
  Administered 2014-02-28 – 2014-03-01 (×2): 2 via ORAL
  Filled 2014-02-28 (×2): qty 2

## 2014-02-28 MED ORDER — ONDANSETRON HCL 4 MG/2ML IJ SOLN: 4.0000 mg | Freq: Four times a day (QID) | INTRAMUSCULAR | Status: DC | PRN

## 2014-02-28 MED ORDER — FENTANYL 2.5 MCG/ML BUPIVACAINE 1/10 % EPIDURAL INFUSION (WH - ANES)
INTRAMUSCULAR | Status: DC | PRN
  Administered 2014-02-28: 14 mL/h via EPIDURAL

## 2014-02-28 MED ORDER — FENTANYL 2.5 MCG/ML BUPIVACAINE 1/10 % EPIDURAL INFUSION (WH - ANES)
INTRAMUSCULAR | Status: AC
  Filled 2014-02-28: qty 125

## 2014-02-28 MED ORDER — OXYCODONE-ACETAMINOPHEN 5-325 MG PO TABS: 1.0000 | ORAL_TABLET | ORAL | Status: DC | PRN

## 2014-02-28 MED ORDER — AMPICILLIN SODIUM 2 G IJ SOLR
2.0000 g | Freq: Four times a day (QID) | INTRAMUSCULAR | Status: DC
  Administered 2014-02-28: 2 g via INTRAVENOUS
  Filled 2014-02-28 (×2): qty 2000

## 2014-02-28 MED ORDER — OXYTOCIN 10 UNIT/ML IJ SOLN
INTRAMUSCULAR | Status: AC
  Filled 2014-02-28: qty 1

## 2014-02-28 MED ORDER — WITCH HAZEL-GLYCERIN EX PADS: 1.0000 "application " | MEDICATED_PAD | CUTANEOUS | Status: DC | PRN

## 2014-02-28 MED ORDER — LANOLIN HYDROUS EX OINT
TOPICAL_OINTMENT | CUTANEOUS | Status: DC | PRN
Start: 2014-02-28 — End: 2014-03-02

## 2014-02-28 MED ORDER — INFLUENZA VAC SPLIT QUAD 0.5 ML IM SUSY
0.5000 mL | PREFILLED_SYRINGE | INTRAMUSCULAR | Status: DC
  Filled 2014-02-28: qty 0.5

## 2014-02-28 MED ORDER — PROMETHAZINE HCL 25 MG/ML IJ SOLN
25.0000 mg | Freq: Four times a day (QID) | INTRAMUSCULAR | Status: DC | PRN
  Filled 2014-02-28: qty 1

## 2014-02-28 MED ORDER — ONDANSETRON HCL 4 MG PO TABS: 4.0000 mg | ORAL_TABLET | ORAL | Status: DC | PRN

## 2014-02-28 MED ORDER — LIDOCAINE HCL (PF) 1 % IJ SOLN
INTRAMUSCULAR | Status: DC | PRN
  Administered 2014-02-28 (×2): 8 mL

## 2014-02-28 MED ORDER — DIPHENHYDRAMINE HCL 25 MG PO CAPS
25.0000 mg | ORAL_CAPSULE | Freq: Four times a day (QID) | ORAL | Status: DC | PRN
Start: 2014-02-28 — End: 2014-03-02

## 2014-02-28 MED ORDER — SIMETHICONE 80 MG PO CHEW: 80.0000 mg | CHEWABLE_TABLET | ORAL | Status: DC | PRN

## 2014-02-28 MED ORDER — OXYTOCIN 40 UNITS IN LACTATED RINGERS INFUSION - SIMPLE MED: 62.5000 mL/h | INTRAVENOUS | Status: DC

## 2014-02-28 MED ORDER — PENICILLIN G POTASSIUM 5000000 UNITS IJ SOLR
2.5000 10*6.[IU] | INTRAVENOUS | Status: DC
  Filled 2014-02-28 (×2): qty 2.5

## 2014-02-28 MED ORDER — OXYCODONE-ACETAMINOPHEN 5-325 MG PO TABS
2.0000 | ORAL_TABLET | ORAL | Status: DC | PRN
  Administered 2014-03-01 – 2014-03-02 (×3): 2 via ORAL
  Filled 2014-02-28 (×3): qty 2

## 2014-02-28 MED ORDER — ONDANSETRON HCL 4 MG/2ML IJ SOLN
4.0000 mg | Freq: Four times a day (QID) | INTRAMUSCULAR | Status: DC | PRN
  Administered 2014-02-28: 4 mg via INTRAVENOUS
  Filled 2014-02-28: qty 2

## 2014-02-28 MED ORDER — OXYCODONE-ACETAMINOPHEN 5-325 MG PO TABS
1.0000 | ORAL_TABLET | ORAL | Status: DC | PRN
  Administered 2014-03-01 (×2): 1 via ORAL
  Filled 2014-02-28 (×2): qty 1

## 2014-02-28 MED ORDER — IBUPROFEN 600 MG PO TABS: 600.0000 mg | ORAL_TABLET | Freq: Four times a day (QID) | ORAL | Status: DC

## 2014-02-28 NOTE — Progress Notes (Signed)
Dr Clearance CootsHarper called and made aware of small trickle of bleeding with fundal exam. Bladder noted to be distended. Pt unable to void. Order to place foley catheter for uterine atony until am.

## 2014-02-28 NOTE — Progress Notes (Signed)
Brittany Vargas is a 23 y.o. [id number] at 3367w1d by LMP admitted for induction of labor due to Post dates. Due date 02-20-14.  Subjective:   Objective: BP 102/71  Pulse 75  Temp(Src) 98.3 F (36.8 C) (Oral)  Resp 18  Ht 5\' 4"  (1.626 m)  Wt 176 lb (79.833 kg)  BMI 30.20 kg/m2  SpO2 100%  LMP 05/16/2013      FHT:  135bpm UC:   regular, every 2-3 minutes SVE:   Dilation: 8.5 Effacement (%): 90 Station: 0 Exam by:: Ace GinsL. Cresenzo, RN  Labs: Lab Results  Component Value Date   WBC 10.4 02/28/2014   HGB 15.2* 02/28/2014   HCT 42.6 02/28/2014   MCV 98.4 02/28/2014   PLT 106* 02/28/2014    Assessment / Plan: Induction of labor due to postterm,  progressing well on pitocin  Labor: Progressing normally Preeclampsia:  n/a Fetal Wellbeing:  Category I Pain Control:  Epidural I/D:  n/a Anticipated MOD:  NSVD  HARPER,CHARLES A 02/28/2014, 8:23 PM

## 2014-02-28 NOTE — H&P (Signed)
Brittany Vargas is a 23 y.o. female presenting for IOL for postdates.. Maternal Medical History:  Fetal activity: Perceived fetal activity is normal.   Last perceived fetal movement was within the past hour.    Prenatal Complications - Diabetes: none.    OB History   Grav Para Term Preterm Abortions TAB SAB Ect Mult Living   4 1 1  2 2    1      Past Medical History  Diagnosis Date  . No pertinent past medical history    Past Surgical History  Procedure Laterality Date  . Wisdom tooth extraction     Family History: family history is not on file. Social History:  reports that she has never smoked. She has never used smokeless tobacco. She reports that she does not drink alcohol or use illicit drugs.   Prenatal Transfer Tool  Maternal Diabetes: No Genetic Screening: Normal Maternal Ultrasounds/Referrals: Normal Fetal Ultrasounds or other Referrals:  None Maternal Substance Abuse:  No Significant Maternal Medications:  None Significant Maternal Lab Results:  Lab values include: Group B Strep positive Other Comments:  None  Review of Systems  All other systems reviewed and are negative.     Blood pressure 108/72, pulse 90, temperature 98.3 F (36.8 C), resp. rate 16, last menstrual period 05/16/2013, unknown if currently breastfeeding. Maternal Exam:  Abdomen: Fetal presentation: vertex  Introitus: Normal vulva. Normal vagina.  Pelvis: adequate for delivery.   Cervix: Cervix evaluated by digital exam.     Physical Exam  Nursing note and vitals reviewed. Constitutional: She is oriented to person, place, and time. She appears well-developed and well-nourished.  HENT:  Head: Normocephalic and atraumatic.  Eyes: Conjunctivae are normal. Pupils are equal, round, and reactive to light.  Neck: Normal range of motion. Neck supple.  Cardiovascular: Normal rate and regular rhythm.   Respiratory: Effort normal.  GI: Soft.  Genitourinary: Vagina normal.  Musculoskeletal:  Normal range of motion.  Neurological: She is alert and oriented to person, place, and time.  Skin: Skin is warm and dry.  Psychiatric: She has a normal mood and affect. Her behavior is normal. Judgment and thought content normal.    Prenatal labs: ABO, Rh:   Antibody:   Rubella:   RPR: NON REAC (06/25 1315)  HBsAg:    HIV: NONREACTIVE (06/25 1315)  GBS: Detected (10/08 1319)   Assessment/Plan: 41 weeks.  IOL for postdates.   HARPER,CHARLES A 02/28/2014, 11:35 AM

## 2014-02-28 NOTE — Anesthesia Preprocedure Evaluation (Signed)

## 2014-02-28 NOTE — Progress Notes (Addendum)
Dr foster made aware of platelet count 96. Order to leave epidural in place until am since pt is having some increased bleeding and is a high PPH.

## 2014-02-28 NOTE — Lactation Note (Signed)
This note was copied from the chart of Brittany Vargas Zilberman. Lactation Consultation Note  Patient Name: Brittany Vargas Hacking JYNWG'NToday's Date: 02/28/2014 Reason for consult: Initial assessment Baby 2 hours of life. Mom states that she dis not nurse first baby. Mom's plan is to breast and formula feed this baby. Baby has already had a bottle. Discussed supply and demand. Mom states that she is too sleepy to nurse now. [facility], aware of OP/BFSG, community resources, and [facility] phone [facility]. Enc mom to call for assistance with latching as needed.   Maternal Data    Feeding Feeding Type:  (Mom states she is very sleepy, just gave bottle earlier. ) Length of feed: 30 min  LATCH Score/Interventions Latch: Grasps breast easily, tongue down, lips flanged, rhythmical sucking.  Audible Swallowing: None Intervention(s): Skin to skin  Type of Nipple: Everted at rest and after stimulation  Comfort (Breast/Nipple): Soft / non-tender     Hold (Positioning): No assistance needed to correctly position infant at breast.  LATCH Score: 8  Lactation Tools Discussed/Used     Consult Status Consult Status: Follow-up Date: 03/01/14 Follow-up type: In-patient    Geralynn OchsWILLIARD, Demarea Lorey 02/28/2014, 11:22 PM

## 2014-02-28 NOTE — Anesthesia Procedure Notes (Signed)
Epidural Patient location during procedure: OB Start time: 02/28/2014 6:36 PM End time: 02/28/2014 6:40 PM  Staffing Anesthesiologist: Leilani AbleHATCHETT, Marella Vanderpol Performed by: anesthesiologist   Preanesthetic Checklist Completed: patient identified, surgical consent, pre-op evaluation, timeout performed, IV checked, risks and benefits discussed and monitors and equipment checked  Epidural Patient position: sitting Prep: site prepped and draped and DuraPrep Patient monitoring: continuous pulse ox and blood pressure Approach: midline Location: L3-L4 Injection technique: LOR air  Needle:  Needle type: Tuohy  Needle gauge: 17 G Needle length: 9 cm and 9 Needle insertion depth: 5 cm cm Catheter type: closed end flexible Catheter size: 19 Gauge Catheter at skin depth: 10 cm Test dose: negative and Other  Assessment Sensory level: T9 Events: blood not aspirated, injection not painful, no injection resistance, negative IV test and no paresthesia  Additional Notes Reason for block:procedure for pain

## 2014-02-28 NOTE — Progress Notes (Signed)
Gayleen Orembigail J Everman is a 23 y.o. [id number] at 3055w1d by LMP admitted for induction of labor due to Post dates. Due date 02-20-14.  Subjective:   Objective: BP 117/76  Pulse 83  Temp(Src) 98.3 F (36.8 C) (Oral)  Resp 18  LMP 05/16/2013      FHT:  FHR: 150 bpm, variability: moderate,  accelerations:  Present,  decelerations:  Absent UC:   none SVE:      Labs: Lab Results  Component Value Date   WBC 7.4 02/23/2014   HGB 14.5 02/23/2014   HCT 41.7 02/23/2014   MCV 98.6 02/23/2014   PLT 116* 02/23/2014    Assessment / Plan: 41 weeks.  2 stage IOL.  Labor: Latent phase Preeclampsia:  n/a Fetal Wellbeing:  Category I Pain Control:  Nubain I/D:  n/a Anticipated MOD:  NSVD  HARPER,CHARLES A 02/28/2014, 11:50 AM

## 2014-03-01 LAB — HIV ANTIBODY (ROUTINE TESTING W REFLEX): HIV 1&2 Ab, 4th Generation: NONREACTIVE

## 2014-03-01 LAB — CBC
HCT: 44.2 % (ref 36.0–46.0)
HEMOGLOBIN: 15.8 g/dL — AB (ref 12.0–15.0)
MCH: 35.1 pg — ABNORMAL HIGH (ref 26.0–34.0)
MCHC: 35.7 g/dL (ref 30.0–36.0)
MCV: 98.2 fL (ref 78.0–100.0)
Platelets: 95 10*3/uL — ABNORMAL LOW (ref 150–400)
RBC: 4.5 MIL/uL (ref 3.87–5.11)
RDW: 13.5 % (ref 11.5–15.5)
WBC: 12.6 10*3/uL — AB (ref 4.0–10.5)

## 2014-03-01 MED ORDER — METHYLERGONOVINE MALEATE 0.2 MG/ML IJ SOLN: 0.2000 mg | Freq: Three times a day (TID) | INTRAMUSCULAR | Status: DC

## 2014-03-01 MED ORDER — INFLUENZA VAC SPLIT QUAD 0.5 ML IM SUSY
0.5000 mL | PREFILLED_SYRINGE | INTRAMUSCULAR | Status: AC
  Administered 2014-03-01: 0.5 mL via INTRAMUSCULAR
  Filled 2014-03-01: qty 0.5

## 2014-03-01 MED ORDER — METHYLERGONOVINE MALEATE 0.2 MG PO TABS
0.2000 mg | ORAL_TABLET | Freq: Three times a day (TID) | ORAL | Status: DC
  Administered 2014-03-01 – 2014-03-02 (×4): 0.2 mg via ORAL
  Filled 2014-03-01 (×4): qty 1

## 2014-03-01 NOTE — Progress Notes (Signed)
UR completed 

## 2014-03-01 NOTE — Progress Notes (Signed)
Post Partum Day 1 Subjective: no complaints  Objective: Blood pressure 97/59, pulse 71, temperature 98.3 F (36.8 C), temperature source Oral, resp. rate 18, height 5\' 4"  (1.626 m), weight 176 lb (79.833 kg), last menstrual period 05/16/2013, SpO2 98.00%, unknown if currently breastfeeding.  Physical Exam:  General: alert and no distress Lochia: appropriate Uterine Fundus: firm Incision: none DVT Evaluation: No evidence of DVT seen on physical exam.   Recent Labs  02/28/14 2202 03/01/14 0700  HGB 15.2* 15.8*  HCT 41.9 44.2    Assessment/Plan: Plan for discharge tomorrow   LOS: 1 day   HARPER,CHARLES A 03/01/2014, 8:00 AM

## 2014-03-01 NOTE — Anesthesia Postprocedure Evaluation (Signed)
  Anesthesia Post-op Note  Patient: Brittany Vargas  Procedure(s) Performed: * No procedures listed *  Patient Location: Mother/Baby  Anesthesia Type:Epidural  Level of Consciousness: awake, alert , oriented and patient cooperative  Airway and Oxygen Therapy: Patient Spontanous Breathing  Post-op Pain: mild  Post-op Assessment: Post-op Vital signs reviewed, Patient's Cardiovascular Status Stable, Respiratory Function Stable, Patent Airway, No headache, No backache, No residual numbness and No residual motor weakness  Post-op Vital Signs: Reviewed and stable  Last Vitals:  Filed Vitals:   03/01/14 0417  BP: 97/59  Pulse: 71  Temp: 36.8 C  Resp: 18    Complications: No apparent anesthesia complications

## 2014-03-01 NOTE — Progress Notes (Signed)
Pitocin still running, motrin still ordered,and epidural still in. Questioned Dr. Clearance CootsHarper as to whether pitocin can be discontinued and if motrin can be given with platelet level. Dr. Clearance CootsHarper ordered to discontinue both and instructed to call anesthesia about epidural catheter. Earl Galasborne, Linda HedgesStefanie ConasaugaHudspeth

## 2014-03-02 MED ORDER — OXYCODONE-ACETAMINOPHEN 5-325 MG PO TABS: 1.0000 | ORAL_TABLET | ORAL | Status: DC | PRN

## 2014-03-02 NOTE — Progress Notes (Signed)
Post Partum Day 2 Subjective: no complaints  Objective: Blood pressure 94/51, pulse 57, temperature 98.1 F (36.7 C), temperature source Oral, resp. rate 18, height 5\' 4"  (1.626 m), weight 176 lb (79.833 kg), last menstrual period 05/16/2013, SpO2 99.00%, unknown if currently breastfeeding.  Physical Exam:  General: alert and no distress Lochia: appropriate Uterine Fundus: firm Incision: none  DVT Evaluation: No evidence of DVT seen on physical exam.   Recent Labs  02/28/14 2202 03/01/14 0700  HGB 15.2* 15.8*  HCT 41.9 44.2    Assessment/Plan: Discharge home   LOS: 2 days   Pihu Basil A 03/02/2014, 8:38 AM

## 2014-03-02 NOTE — Discharge Summary (Signed)
Obstetric Discharge Summary Reason for Admission: induction of labor Prenatal Procedures: ultrasound Intrapartum Procedures: spontaneous vaginal delivery Postpartum Procedures: none Complications-Operative and Postpartum: none Hemoglobin  Date Value Ref Range Status  03/01/2014 15.8* 12.0 - 15.0 g/dL Final     HCT  Date Value Ref Range Status  03/01/2014 44.2  36.0 - 46.0 % Final    Physical Exam:  General: alert and no distress Lochia: appropriate Uterine Fundus: firm Incision: none DVT Evaluation: No evidence of DVT seen on physical exam.  Discharge Diagnoses: Term Pregnancy-delivered  Discharge Information: Date: 03/02/2014 Activity: pelvic rest Diet: routine Medications: PNV, Colace and Percocet Condition: stable Instructions: refer to practice specific booklet Discharge to: home Follow-up Information   Follow up with Muzamil Harker A, MD. Schedule an appointment as soon as possible for a visit in 1 week.   Specialty:  Obstetrics and Gynecology   Contact information:   [address] [place] [address] [phone]       Newborn Data: Live born female  Birth Weight: 9 lb 3.6 oz (4185 g) APGAR: 9, 9  Home with mother.  Denna Fryberger A 03/02/2014, 8:41 AM

## 2014-03-02 NOTE — Lactation Note (Signed)
This note was copied from the chart of Brittany Porfirio Oarbigail Sobel. Lactation Consultation Note: Follow up visit with this experienced mom before DC. She reports that baby was feeding a lot through the night and she feels he wasn't getting enough milk. Gave some formula. Reports her nipples are tender. They look intact. No redness noted. Comfort gels given with instructions for use. Mom placed them on nipples and reports they feel good. Reports breasts are feeling a little fuller this morning. Reviewed engorgement prevention and treatment. Encouraged to nurse baby first to soften breasts No questions at present. To call prn  Patient Name: Brittany Vargas AVWUJ'WToday's Date: 03/02/2014 Reason for consult: Follow-up assessment   Maternal Data Formula Feeding for Exclusion: No Has patient been taught Hand Expression?: Yes Does the patient have breastfeeding experience prior to this delivery?: Yes  Feeding   LATCH Score/Interventions                      Lactation Tools Discussed/Used     Consult Status Consult Status: Complete    Pamelia HoitWeeks, [name] 03/02/2014, 9:47 AM

## 2014-03-20 ENCOUNTER — Encounter (HOSPITAL_COMMUNITY): Payer: Self-pay

## 2014-03-21 ENCOUNTER — Ambulatory Visit (INDEPENDENT_AMBULATORY_CARE_PROVIDER_SITE_OTHER): Payer: Medicaid Other | Admitting: Obstetrics

## 2014-03-21 ENCOUNTER — Encounter: Payer: Self-pay | Admitting: Obstetrics

## 2014-03-21 DIAGNOSIS — Z30018 Encounter for initial prescription of other contraceptives: Secondary | ICD-10-CM

## 2014-03-21 MED ORDER — NORETHINDRONE 0.35 MG PO TABS
1.0000 | ORAL_TABLET | Freq: Every day | ORAL | Status: DC
Start: 2014-03-21 — End: 2015-07-27

## 2014-03-21 NOTE — Progress Notes (Signed)
Subjective:     Brittany Vargas is a 23 y.o. female who presents for a postpartum visit. She is 3 weeks postpartum following a spontaneous vaginal delivery. I have fully reviewed the prenatal and intrapartum course. The delivery was at 41 gestational weeks. Outcome: spontaneous vaginal delivery. Anesthesia: epidural. Postpartum course has been normal. Baby's course has been normal. Baby is feeding by breast. Bleeding thin lochia. Bowel function is normal. Bladder function is normal. Patient is not sexually active. Contraception method is abstinence. Postpartum depression screening: negative.  Tobacco, alcohol and substance abuse history reviewed.  Adult immunizations reviewed including TDAP, rubella and varicella.  The following portions of the patient's history were reviewed and updated as appropriate: allergies, current medications, past family history, past medical history, past social history, past surgical history and problem list.  Review of Systems A comprehensive review of systems was negative.   Objective:    BP 103/66 mmHg  Pulse 70  Temp(Src) 98.1 F (36.7 C)  Ht 5\' 4"  (1.626 m)  Wt 157 lb (71.215 kg)  BMI 26.94 kg/m2  Breastfeeding? Yes   PE:  Deferred   100% of 10 min visit spent on counseling and coordination of care.  Assessment:    Postpartum, 3 weeks.  Doing well.  Contraceptive counseling.  Breastfeeding.  Wants OCP.  Plan:    1. Contraception: oral progesterone-only contraceptive 2. Micronor Rx. 3. Follow up in: 6 weeks or as needed.   Healthy lifestyle practices reviewed

## 2014-05-02 ENCOUNTER — Ambulatory Visit (INDEPENDENT_AMBULATORY_CARE_PROVIDER_SITE_OTHER): Payer: Medicaid Other | Admitting: Obstetrics

## 2014-05-02 ENCOUNTER — Encounter: Payer: Self-pay | Admitting: Obstetrics

## 2014-05-02 DIAGNOSIS — Z30011 Encounter for initial prescription of contraceptive pills: Secondary | ICD-10-CM

## 2014-05-02 MED ORDER — LEVONORGESTREL-ETHINYL ESTRAD 0.15-30 MG-MCG PO TABS: 1.0000 | ORAL_TABLET | Freq: Every day | ORAL | Status: DC

## 2014-05-02 NOTE — Progress Notes (Signed)
Subjective:     Brittany Vargas is a 23 y.o. female who presents for a postpartum visit. She is 8 weeks postpartum following a spontaneous vaginal delivery. I have fully reviewed the prenatal and intrapartum course. The delivery was at 41 gestational weeks. Outcome: spontaneous vaginal delivery. Anesthesia: epidural. Postpartum course has been normal. Baby's course has been normal. Baby is feeding by bottle - Similac. Bleeding no bleeding. Bowel function is normal. Bladder function is normal. Patient is sexually active. Contraception method is oral progesterone-only contraceptive. Postpartum depression screening: negative.  Tobacco, alcohol and substance abuse history reviewed.  Adult immunizations reviewed including TDAP, rubella and varicella.  The following portions of the patient's history were reviewed and updated as appropriate: allergies, current medications, past family history, past medical history, past social history, past surgical history and problem list.  Review of Systems A comprehensive review of systems was negative.   Objective:    BP 106/69 mmHg  Pulse 89  Temp(Src) 98.2 F (36.8 C)  Ht 5\' 4"  (1.626 m)  Wt 163 lb (73.936 kg)  BMI 27.97 kg/m2  LMP 03/28/2014 (Exact Date)  Breastfeeding? No  General:  alert and no distress   Breasts:  inspection negative, no nipple discharge or bleeding, no masses or nodularity palpable  Lungs: clear to auscultation bilaterally  Heart:  regular rate and rhythm, S1, S2 normal, no murmur, click, rub or gallop  Abdomen: normal findings: soft, non-tender   Vulva:  normal  Vagina: normal vagina  Cervix:  no cervical motion tenderness  Corpus: normal size, contour, position, consistency, mobility, non-tender  Adnexa:  no mass, fullness, tenderness  Rectal Exam: Not performed.          Assessment:     Normal postpartum exam. Pap smear not done at today's visit.  Plan:    1. Contraception: OCP (estrogen/progesterone) 2. D/C  Micronor 3. Follow up in: 6 months or as needed.   Healthy lifestyle practices reviewed

## 2014-05-15 ENCOUNTER — Encounter: Payer: Self-pay | Admitting: *Deleted

## 2014-05-16 ENCOUNTER — Encounter: Payer: Self-pay | Admitting: Obstetrics & Gynecology

## 2014-11-02 ENCOUNTER — Ambulatory Visit: Payer: Medicaid Other | Admitting: Obstetrics

## 2015-02-21 ENCOUNTER — Encounter: Payer: Self-pay | Admitting: *Deleted

## 2015-02-26 ENCOUNTER — Institutional Professional Consult (permissible substitution): Payer: Medicaid Other | Admitting: Obstetrics

## 2015-07-27 ENCOUNTER — Other Ambulatory Visit: Payer: Self-pay | Admitting: Obstetrics

## 2015-12-12 ENCOUNTER — Other Ambulatory Visit (INDEPENDENT_AMBULATORY_CARE_PROVIDER_SITE_OTHER): Payer: Medicaid Other | Admitting: *Deleted

## 2015-12-12 VITALS — BP 101/72 | HR 65 | Wt 165.0 lb

## 2015-12-12 DIAGNOSIS — Z3200 Encounter for pregnancy test, result unknown: Secondary | ICD-10-CM

## 2015-12-12 DIAGNOSIS — Z3201 Encounter for pregnancy test, result positive: Secondary | ICD-10-CM

## 2015-12-12 LAB — POCT URINE PREGNANCY: PREG TEST UR: POSITIVE — AB

## 2015-12-12 MED ORDER — TERCONAZOLE 0.4 % VA CREA: 1.0000 | TOPICAL_CREAM | Freq: Every day | VAGINAL | 0 refills | Status: DC

## 2015-12-12 NOTE — Progress Notes (Signed)
Pt is in office today for UPT/confirm pregnancy. Pt states that she has had +home test. UPT in office today is positive. Pt states LMP 10-08-15, dating her at 9.[redacted] weeks gestation. EDD of 07-14-16.  Pt states that she is having some increase d/c and itching. Pt made aware that Terazol cream could be sent per protocol.  Pt advised if symptoms do not resolve to call office.  Pt made aware of any urgent needs to be seen at [facility]. Pt advised that she may call office at any time if she has questions/concerns before NOB appt. Pt advised to make NOB appt today at check out.

## 2015-12-25 ENCOUNTER — Other Ambulatory Visit: Payer: Self-pay | Admitting: Certified Nurse Midwife

## 2015-12-25 ENCOUNTER — Encounter: Payer: Self-pay | Admitting: Obstetrics

## 2015-12-25 ENCOUNTER — Ambulatory Visit (INDEPENDENT_AMBULATORY_CARE_PROVIDER_SITE_OTHER): Payer: Medicaid Other | Admitting: Obstetrics

## 2015-12-25 VITALS — BP 108/69 | HR 64 | Temp 98.7°F | Wt 167.6 lb

## 2015-12-25 DIAGNOSIS — Z3491 Encounter for supervision of normal pregnancy, unspecified, first trimester: Secondary | ICD-10-CM | POA: Diagnosis not present

## 2015-12-25 DIAGNOSIS — O2341 Unspecified infection of urinary tract in pregnancy, first trimester: Secondary | ICD-10-CM

## 2015-12-25 LAB — POCT URINALYSIS DIPSTICK
Bilirubin, UA: NEGATIVE
Blood, UA: NEGATIVE
GLUCOSE UA: NEGATIVE
Ketones, UA: NEGATIVE
NITRITE UA: POSITIVE
PROTEIN UA: NEGATIVE
Spec Grav, UA: 1.015
UROBILINOGEN UA: 0.2
pH, UA: 8

## 2015-12-25 MED ORDER — PRENATE MINI 29-0.6-0.4-350 MG PO CAPS: 1.0000 | ORAL_CAPSULE | Freq: Every day | ORAL | 3 refills | Status: DC

## 2015-12-25 MED ORDER — NITROFURANTOIN MONOHYD MACRO 100 MG PO CAPS: 100.0000 mg | ORAL_CAPSULE | Freq: Two times a day (BID) | ORAL | 0 refills | Status: AC

## 2015-12-25 NOTE — Progress Notes (Signed)
Subjective:    Brittany Vargas is being seen today for her first obstetrical visit.  This is a planned pregnancy. She is at 7524w1d gestation. Her obstetrical history is significant for none. Relationship with FOB: significant other, living together. Patient does intend to breast feed. Pregnancy history fully reviewed.  The information documented in the HPI was reviewed and verified.  Menstrual History: OB History    Gravida Para Term Preterm AB Living   5 2 2   2 2    SAB TAB Ectopic Multiple Live Births     2     2       Patient's last menstrual period was 10/08/2015.    Past Medical History:  Diagnosis Date  . No pertinent past medical history     Past Surgical History:  Procedure Laterality Date  . WISDOM TOOTH EXTRACTION       (Not in a hospital admission) No Known Allergies  Social History  Substance Use Topics  . Smoking status: Never Smoker  . Smokeless tobacco: Never Used  . Alcohol use No    Family History  Problem Relation Age of Onset  . Cancer Maternal Grandfather      Review of Systems Constitutional: negative for weight loss Gastrointestinal: negative for vomiting Genitourinary:negative for genital lesions and vaginal discharge and dysuria Musculoskeletal:negative for back pain Behavioral/Psych: negative for abusive relationship, depression, illegal drug usage and tobacco use    Objective:    BP 108/69   Pulse 64   Temp 98.7 F (37.1 C)   Wt 167 lb 9.6 oz (76 kg)   LMP 10/08/2015   BMI 28.77 kg/m  General Appearance:    Alert, cooperative, no distress, appears stated age  Head:    Normocephalic, without obvious abnormality, atraumatic  Eyes:    PERRL, conjunctiva/corneas clear, EOM's intact, fundi    benign, both eyes  Ears:    Normal TM's and external ear canals, both ears  Nose:   Nares normal, septum midline, mucosa normal, no drainage    or sinus tenderness  Throat:   Lips, mucosa, and tongue normal; teeth and gums normal  Neck:    Supple, symmetrical, trachea midline, no adenopathy;    thyroid:  no enlargement/tenderness/nodules; no carotid   bruit or JVD  Back:     Symmetric, no curvature, ROM normal, no CVA tenderness  Lungs:     Clear to auscultation bilaterally, respirations unlabored  Chest Wall:    No tenderness or deformity   Heart:    Regular rate and rhythm, S1 and S2 normal, no murmur, rub   or gallop  Breast Exam:    No tenderness, masses, or nipple abnormality  Abdomen:     Soft, non-tender, bowel sounds active all four quadrants,    no masses, no organomegaly  Genitalia:    Normal female without lesion, discharge or tenderness  Extremities:   Extremities normal, atraumatic, no cyanosis or edema  Pulses:   2+ and symmetric all extremities  Skin:   Skin color, texture, turgor normal, no rashes or lesions  Lymph nodes:   Cervical, supraclavicular, and axillary nodes normal  Neurologic:   CNII-XII intact, normal strength, sensation and reflexes    throughout      Lab Review Urine pregnancy test Labs reviewed yes Radiologic studies reviewed no Assessment:    Pregnancy at 5524w1d weeks    Plan:      Prenatal vitamins.  Counseling provided regarding continued use of seat belts, cessation of alcohol consumption,  smoking or use of illicit drugs; infection precautions i.e., influenza/TDAP immunizations, toxoplasmosis,CMV, parvovirus, listeria and varicella; workplace safety, exercise during pregnancy; routine dental care, safe medications, sexual activity, hot tubs, saunas, pools, travel, caffeine use, fish and methlymercury, potential toxins, hair treatments, varicose veins Weight gain recommendations per IOM guidelines reviewed: underweight/BMI< 18.5--> gain 28 - 40 lbs; normal weight/BMI 18.5 - 24.9--> gain 25 - 35 lbs; overweight/BMI 25 - 29.9--> gain 15 - 25 lbs; obese/BMI >30->gain  11 - 20 lbs Problem list reviewed and updated. FIRST/CF mutation testing/NIPT/QUAD SCREEN/fragile X/Ashkenazi Jewish  population testing/Spinal muscular atrophy discussed: requested. Role of ultrasound in pregnancy discussed; fetal survey: requested. Amniocentesis discussed: not indicated. VBAC calculator score: VBAC consent form provided No orders of the defined types were placed in this encounter.  No orders of the defined types were placed in this encounter.   Follow up in 4 weeks.

## 2015-12-27 LAB — PAP IG W/ RFLX HPV ASCU: PAP Smear Comment: 0

## 2015-12-27 LAB — CULTURE, URINE COMPREHENSIVE

## 2015-12-29 LAB — NUSWAB VG+, CANDIDA 6SP
Atopobium vaginae: HIGH Score — AB
CANDIDA KRUSEI, NAA: NEGATIVE
CANDIDA LUSITANIAE, NAA: NEGATIVE
CANDIDA PARAPSILOSIS, NAA: NEGATIVE
Candida albicans, NAA: POSITIVE — AB
Candida glabrata, NAA: NEGATIVE
Candida tropicalis, NAA: NEGATIVE
Chlamydia trachomatis, NAA: NEGATIVE
Neisseria gonorrhoeae, NAA: NEGATIVE
Trich vag by NAA: NEGATIVE

## 2016-01-01 ENCOUNTER — Other Ambulatory Visit: Payer: Self-pay | Admitting: Obstetrics

## 2016-01-01 DIAGNOSIS — B9689 Other specified bacterial agents as the cause of diseases classified elsewhere: Secondary | ICD-10-CM

## 2016-01-01 DIAGNOSIS — N760 Acute vaginitis: Principal | ICD-10-CM

## 2016-01-01 MED ORDER — METRONIDAZOLE 500 MG PO TABS: 500.0000 mg | ORAL_TABLET | Freq: Two times a day (BID) | ORAL | 2 refills | Status: DC

## 2016-01-22 ENCOUNTER — Encounter: Payer: Self-pay | Admitting: Obstetrics

## 2016-01-22 ENCOUNTER — Ambulatory Visit (INDEPENDENT_AMBULATORY_CARE_PROVIDER_SITE_OTHER): Payer: Medicaid Other | Admitting: Obstetrics

## 2016-01-22 VITALS — BP 110/67 | HR 76 | Temp 98.7°F | Wt 166.0 lb

## 2016-01-22 DIAGNOSIS — Z3492 Encounter for supervision of normal pregnancy, unspecified, second trimester: Secondary | ICD-10-CM

## 2016-01-22 NOTE — Progress Notes (Signed)
  Subjective:    Brittany Vargas is a 25 y.o. female being seen today for her obstetrical visit. She is at 5944w1d gestation. Patient reports: no complaints.  Problem List Items Addressed This Visit    None    Visit Diagnoses    Prenatal care in second trimester    -  Primary   Relevant Orders   AFP, Quad Screen   US OB Comp + 14 Wk     Patient Active Problem List   Diagnosis Date Noted  . Personal history of previous postdates pregnancy 02/28/2014  . Post-dates pregnancy 02/28/2014  . NSVD (normal spontaneous vaginal delivery) 02/28/2014  . GBS (group B Streptococcus carrier), +RV culture, currently pregnant 02/26/2014  . Thrombocytopenia complicating pregnancy (HCC) 12/05/2013  . Unsure of LMP (last menstrual period) as reason for ultrasound scan 07/18/2013    Objective:     BP 110/67   Pulse 76   Temp 98.7 F (37.1 C)   Wt 166 lb (75.3 kg)   LMP 10/08/2015   BMI 28.49 kg/m  Uterine Size: Below umbilicus     Assessment:    Pregnancy @ 844w1d  weeks Doing well    Plan:    Problem list reviewed and updated. Labs reviewed.  Follow up in 4 weeks. FIRST/CF mutation testing/NIPT/QUAD SCREEN/fragile X/Ashkenazi Jewish population testing/Spinal muscular atrophy discussed: requested. Role of ultrasound in pregnancy discussed; fetal survey: requested. Amniocentesis discussed: not indicated. 50% of 15 minute visit spent on counseling and coordination of care.

## 2016-02-19 ENCOUNTER — Ambulatory Visit (INDEPENDENT_AMBULATORY_CARE_PROVIDER_SITE_OTHER): Payer: Medicaid Other

## 2016-02-19 ENCOUNTER — Ambulatory Visit (INDEPENDENT_AMBULATORY_CARE_PROVIDER_SITE_OTHER): Payer: Medicaid Other | Admitting: Obstetrics

## 2016-02-19 VITALS — BP 100/62 | HR 68 | Temp 97.9°F | Wt 170.3 lb

## 2016-02-19 DIAGNOSIS — Z3492 Encounter for supervision of normal pregnancy, unspecified, second trimester: Secondary | ICD-10-CM | POA: Diagnosis not present

## 2016-02-20 LAB — PRENATAL PROFILE I(LABCORP)
ANTIBODY SCREEN: NEGATIVE
BASOS: 0 %
Basophils Absolute: 0 10*3/uL (ref 0.0–0.2)
EOS (ABSOLUTE): 0.2 10*3/uL (ref 0.0–0.4)
Eos: 2 %
HEMATOCRIT: 40 % (ref 34.0–46.6)
Hemoglobin: 13.8 g/dL (ref 11.1–15.9)
Hepatitis B Surface Ag: NEGATIVE
Immature Grans (Abs): 0 10*3/uL (ref 0.0–0.1)
Immature Granulocytes: 1 %
Lymphocytes Absolute: 1.7 10*3/uL (ref 0.7–3.1)
Lymphs: 20 %
MCH: 32.6 pg (ref 26.6–33.0)
MCHC: 34.5 g/dL (ref 31.5–35.7)
MCV: 95 fL (ref 79–97)
MONOS ABS: 0.6 10*3/uL (ref 0.1–0.9)
Monocytes: 7 %
Neutrophils Absolute: 5.9 10*3/uL (ref 1.4–7.0)
Neutrophils: 70 %
Platelets: 156 10*3/uL (ref 150–379)
RBC: 4.23 x10E6/uL (ref 3.77–5.28)
RDW: 13.6 % (ref 12.3–15.4)
RPR: NONREACTIVE
RUBELLA: 3.67 {index} (ref >0.99)
Rh Factor: POSITIVE
WBC: 8.4 10*3/uL (ref 3.4–10.8)

## 2016-02-20 LAB — AFP, QUAD SCREEN
DIA Mom Value: 1.13
DIA VALUE (EIA): 203.72 pg/mL
DSR (By Age)    1 IN: 905
DSR (Second Trimester) 1 IN: 4972
GESTATIONAL AGE AFP: 15.1 wk
MSAFP Mom: 0.94
MSAFP: 25 ng/mL
MSHCG Mom: 1
MSHCG: 50509 m[IU]/mL
Maternal Age At EDD: 27.3 YEARS
OSB RISK: 10000
T18 (By Age): 1:3525 {titer}
Test Results:: NEGATIVE
UE3 MOM: 1.26
UE3 VALUE: 0.75 ng/mL
Weight: 166 [lb_av]

## 2016-02-20 LAB — VARICELLA ZOSTER ANTIBODY, IGG: VARICELLA: 1017 {index} (ref >165)

## 2016-02-20 LAB — HEMOGLOBINOPATHY EVALUATION
HGB C: 0 %
HGB S: 0 %
Hemoglobin A2 Quantitation: 2.5 % (ref 0.7–3.1)
Hemoglobin F Quantitation: 0 % (ref 0.0–2.0)
Hgb A: 97.5 % (ref 94.0–98.0)

## 2016-02-20 LAB — VITAMIN D 25 HYDROXY (VIT D DEFICIENCY, FRACTURES): VIT D 25 HYDROXY: 23 ng/mL — AB (ref 30.0–100.0)

## 2016-02-20 LAB — HIV ANTIBODY (ROUTINE TESTING W REFLEX): HIV SCREEN 4TH GENERATION: NONREACTIVE

## 2016-02-23 LAB — URINE CULTURE, OB REFLEX

## 2016-02-23 LAB — CULTURE, OB URINE

## 2016-02-25 ENCOUNTER — Other Ambulatory Visit: Payer: Self-pay | Admitting: Obstetrics

## 2016-02-25 ENCOUNTER — Encounter: Payer: Self-pay | Admitting: Obstetrics

## 2016-02-25 DIAGNOSIS — O2342 Unspecified infection of urinary tract in pregnancy, second trimester: Secondary | ICD-10-CM

## 2016-02-25 MED ORDER — AMOXICILLIN-POT CLAVULANATE 875-125 MG PO TABS: 1.0000 | ORAL_TABLET | Freq: Two times a day (BID) | ORAL | 0 refills | Status: DC

## 2016-02-25 NOTE — Progress Notes (Signed)
Subjective:    Brittany Vargas is a 25 y.o. female being seen today for her obstetrical visit. She is at 6741w0d gestation. Patient reports: no complaints . Fetal movement: normal.  Problem List Items Addressed This Visit    None    Visit Diagnoses    Prenatal care in second trimester    -  Primary   Relevant Orders   Prenatal Profile I (Completed)   HIV antibody (Completed)   Hemoglobinopathy evaluation (Completed)   Varicella zoster antibody, IgG (Completed)   VITAMIN D 25 Hydroxy (Vit-D Deficiency, Fractures) (Completed)   Culture, OB Urine (Completed)     Patient Active Problem List   Diagnosis Date Noted  . Personal history of previous postdates pregnancy 02/28/2014  . Post-dates pregnancy 02/28/2014  . NSVD (normal spontaneous vaginal delivery) 02/28/2014  . GBS (group B Streptococcus carrier), +RV culture, currently pregnant 02/26/2014  . Thrombocytopenia complicating pregnancy (HCC) 12/05/2013  . Unsure of LMP (last menstrual period) as reason for ultrasound scan 07/18/2013   Objective:    BP 100/62   Pulse 68   Temp 97.9 F (36.6 C)   Wt 170 lb 4.8 oz (77.2 kg)   LMP 10/08/2015   BMI 29.23 kg/m  FHT: 150 BPM  Uterine Size: size equals dates     Assessment:    Pregnancy @ 2441w0d    Plan:    Signs and symptoms of preterm labor: discussed.  Labs, problem list reviewed and updated 2 hr GTT planned Follow up in 4 weeks.

## 2016-03-18 ENCOUNTER — Encounter: Payer: Medicaid Other | Admitting: Obstetrics

## 2016-03-18 DIAGNOSIS — Z3490 Encounter for supervision of normal pregnancy, unspecified, unspecified trimester: Secondary | ICD-10-CM | POA: Insufficient documentation

## 2016-03-28 ENCOUNTER — Ambulatory Visit (INDEPENDENT_AMBULATORY_CARE_PROVIDER_SITE_OTHER): Payer: Medicaid Other | Admitting: Obstetrics

## 2016-03-28 ENCOUNTER — Encounter: Payer: Self-pay | Admitting: Obstetrics

## 2016-03-28 VITALS — BP 104/64 | HR 90 | Temp 97.0°F | Wt 175.3 lb

## 2016-03-28 DIAGNOSIS — Z3492 Encounter for supervision of normal pregnancy, unspecified, second trimester: Secondary | ICD-10-CM

## 2016-03-28 NOTE — Patient Instructions (Addendum)
Glucose Tolerance Test During Pregnancy The glucose tolerance test (GTT) is a blood test used to determine if you have developed a type of diabetes during pregnancy (gestational diabetes). This is when your body does not properly process sugar (glucose) in the food you eat, resulting in high blood glucose levels. Typically, a GTT is done after you have had a 1-hour glucose test with results that indicate you possibly have gestational diabetes. It may also be done if:  You have a history of giving birth to very large babies or have experienced repeated fetal loss (stillbirth).   You have signs and symptoms of diabetes, such as:   Changes in your vision.   Tingling or numbness in your hands or feet.   Changes in hunger, thirst, and urination not otherwise explained by your pregnancy.  The GTT lasts about 3 hours. You will be given a sugar-water solution to drink at the beginning of the test. You will have blood drawn before you drink the solution and then again 1, 2, and 3 hours after you drink it. You will not be allowed to eat or drink anything else during the test. You must remain at the testing location to make sure that your blood is drawn on time. You should also avoid exercising during the test, because exercise can alter test results. PREPARATION FOR TEST  Eat normally for 3 days prior to the GTT test, including having plenty of carbohydrate-rich foods. Do not eat or drink anything except water during the final 12 hours before the test. In addition, your health care provider may ask you to stop taking certain medicines before the test. RESULTS  It is your responsibility to obtain your test results. Ask the lab or department performing the test when and how you will get your results. Contact your health care provider to discuss any questions you have about your results.  Range of Normal Values Ranges for normal values may vary among different labs and hospitals. You should always check  with your health care provider after having lab work or other tests done to discuss whether your values are considered within normal limits. Normal levels of blood glucose are as follows:  Fasting: less than 105 mg/dL.   1 hour after drinking the solution: less than 190 mg/dL.   2 hours after drinking the solution: less than 165 mg/dL.   3 hours after drinking the solution: less than 145 mg/dL.  Some substances can interfere with GTT results. These may include:  Blood pressure and heart failure medicines, including beta blockers, furosemide, and thiazides.   Anti-inflammatory medicines, including aspirin.   Nicotine.   Some psychiatric medicines.  Meaning of Results Outside Normal Value Ranges GTT test results that are above normal values may indicate a number of health problems, such as:   Gestational diabetes.   Acute stress response.   Cushing syndrome.   Tumors such as pheochromocytoma or glucagonoma.   Long-term kidney problems.   Pancreatitis.   Hyperthyroidism.   Current infection.  Discuss your test results with your health care provider. He or she will use the results to make a diagnosis and determine a treatment plan that is right for you.   This information is not intended to replace advice given to you by your health care provider. Make sure you discuss any questions you have with your health care provider.   Document Released: 11/04/2011 Document Revised: 05/26/2014 Document Reviewed: 09/09/2013 Elsevier Interactive Patient Education 2016 Elsevier Inc.  

## 2016-03-28 NOTE — Progress Notes (Signed)
Subjective:    Brittany Vargas is a 25 y.o. female being seen today for her obstetrical visit. She is at 316w4d gestation. Patient reports: no complaints . Fetal movement: normal.  Problem List Items Addressed This Visit    None     Patient Active Problem List   Diagnosis Date Noted  . Supervision of normal pregnancy, antepartum 03/18/2016  . Personal history of previous postdates pregnancy 02/28/2014  . Post-dates pregnancy 02/28/2014  . NSVD (normal spontaneous vaginal delivery) 02/28/2014  . GBS (group B Streptococcus carrier), +RV culture, currently pregnant 02/26/2014  . Thrombocytopenia complicating pregnancy (HCC) 12/05/2013  . Unsure of LMP (last menstrual period) as reason for ultrasound scan 07/18/2013   Objective:    LMP 10/08/2015  FHT: 160 BPM  Uterine Size: size equals dates     Assessment:    Pregnancy @ 986w4d    Plan:    OBGCT: ordered for next visit. Signs and symptoms of preterm labor: discussed.  Labs, problem list reviewed and updated 2 hr GTT planned Follow up in 4 weeks.

## 2016-04-25 ENCOUNTER — Encounter: Payer: Self-pay | Admitting: Obstetrics

## 2016-04-25 ENCOUNTER — Ambulatory Visit (INDEPENDENT_AMBULATORY_CARE_PROVIDER_SITE_OTHER): Payer: Medicaid Other | Admitting: Obstetrics

## 2016-04-25 ENCOUNTER — Other Ambulatory Visit: Payer: Medicaid Other

## 2016-04-25 VITALS — BP 112/80 | HR 80 | Temp 98.2°F | Wt 177.7 lb

## 2016-04-25 DIAGNOSIS — Z3480 Encounter for supervision of other normal pregnancy, unspecified trimester: Secondary | ICD-10-CM

## 2016-04-25 DIAGNOSIS — Z3483 Encounter for supervision of other normal pregnancy, third trimester: Secondary | ICD-10-CM

## 2016-04-25 NOTE — Progress Notes (Signed)
Patient has not received results of blood work done two months ago.

## 2016-04-25 NOTE — Progress Notes (Signed)
Subjective:    Brittany Vargas is a 25 y.o. female being seen today for her obstetrical visit. She is at 7026w4d gestation. Patient reports no complaints. Fetal movement: normal.  Problem List Items Addressed This Visit    None    Visit Diagnoses    Supervision of other normal pregnancy, antepartum    -  Primary     Patient Active Problem List   Diagnosis Date Noted  . Supervision of normal pregnancy, antepartum 03/18/2016  . Personal history of previous postdates pregnancy 02/28/2014  . Post-dates pregnancy 02/28/2014  . NSVD (normal spontaneous vaginal delivery) 02/28/2014  . GBS (group B Streptococcus carrier), +RV culture, currently pregnant 02/26/2014  . Thrombocytopenia complicating pregnancy (HCC) 12/05/2013  . Unsure of LMP (last menstrual period) as reason for ultrasound scan 07/18/2013   Objective:    BP 112/80   Pulse 80   Temp 98.2 F (36.8 C)   Wt 177 lb 11.2 oz (80.6 kg)   LMP 10/08/2015   BMI 30.50 kg/m  FHT:  150 BPM  Uterine Size: size equals dates  Presentation: unsure     Assessment:    Pregnancy @ 6826w4d weeks   Plan:     labs reviewed, problem list updated Consent signed. GBS sent TDAP offered  Rhogam given for RH negative Pediatrician: discussed. Infant feeding: plans to breastfeed. Maternity leave: discussed. Cigarette smoking: never smoked. No orders of the defined types were placed in this encounter.  No orders of the defined types were placed in this encounter.  Follow up in 2 Weeks.

## 2016-04-26 LAB — CBC
HEMATOCRIT: 39.5 % (ref 34.0–46.6)
Hemoglobin: 13.6 g/dL (ref 11.1–15.9)
MCH: 33.1 pg — ABNORMAL HIGH (ref 26.6–33.0)
MCHC: 34.4 g/dL (ref 31.5–35.7)
MCV: 96 fL (ref 79–97)
PLATELETS: 137 10*3/uL — AB (ref 150–379)
RBC: 4.11 x10E6/uL (ref 3.77–5.28)
RDW: 14.1 % (ref 12.3–15.4)
WBC: 8.4 10*3/uL (ref 3.4–10.8)

## 2016-04-26 LAB — RPR: RPR: NONREACTIVE

## 2016-04-26 LAB — GLUCOSE TOLERANCE, 2 HOURS W/ 1HR
GLUCOSE, 2 HOUR: 107 mg/dL (ref 65–152)
Glucose, 1 hour: 103 mg/dL (ref 65–179)
Glucose, Fasting: 76 mg/dL (ref 65–91)

## 2016-04-26 LAB — HIV ANTIBODY (ROUTINE TESTING W REFLEX): HIV Screen 4th Generation wRfx: NONREACTIVE

## 2016-05-13 ENCOUNTER — Encounter: Payer: Medicaid Other | Admitting: Obstetrics

## 2016-05-19 NOTE — L&D Delivery Note (Signed)
Brittany Vargas is a 26 y.o. [id number] at 2124w1d who underwent a NSVD at 1516 under epidural anesthesia. Placenta delivered complete and intact at 1522. APGARS: 8/9  Anesthesia: epidural Repair: none EBL: 200cc Baby: skin to skin, couplet care  Brittany Vargas, Brittany Vargas  3:41 PM 07/15/16

## 2016-05-28 ENCOUNTER — Encounter (HOSPITAL_COMMUNITY): Payer: Self-pay | Admitting: *Deleted

## 2016-05-28 ENCOUNTER — Inpatient Hospital Stay (HOSPITAL_COMMUNITY)
Admission: AD | Admit: 2016-05-28 | Discharge: 2016-05-28 | Disposition: A | Discharge status: Home / Self Care | Payer: Medicaid Other | Source: Ambulatory Visit | Attending: Obstetrics and Gynecology | Admitting: Obstetrics and Gynecology

## 2016-05-28 DIAGNOSIS — Z3A33 33 weeks gestation of pregnancy: Secondary | ICD-10-CM | POA: Diagnosis not present

## 2016-05-28 DIAGNOSIS — R102 Pelvic and perineal pain: Secondary | ICD-10-CM | POA: Diagnosis not present

## 2016-05-28 DIAGNOSIS — O26893 Other specified pregnancy related conditions, third trimester: Secondary | ICD-10-CM | POA: Diagnosis not present

## 2016-05-28 DIAGNOSIS — N949 Unspecified condition associated with female genital organs and menstrual cycle: Secondary | ICD-10-CM

## 2016-05-28 DIAGNOSIS — Z809 Family history of malignant neoplasm, unspecified: Secondary | ICD-10-CM | POA: Diagnosis not present

## 2016-05-28 DIAGNOSIS — M545 Low back pain: Secondary | ICD-10-CM | POA: Diagnosis not present

## 2016-05-28 LAB — URINALYSIS, ROUTINE W REFLEX MICROSCOPIC
Bilirubin Urine: NEGATIVE
Glucose, UA: NEGATIVE mg/dL
HGB URINE DIPSTICK: NEGATIVE
Ketones, ur: NEGATIVE mg/dL
Leukocytes, UA: NEGATIVE
Nitrite: NEGATIVE
PROTEIN: NEGATIVE mg/dL
Specific Gravity, Urine: 1.008 (ref 1.005–1.030)
pH: 7 (ref 5.0–8.0)

## 2016-05-28 NOTE — Discharge Instructions (Signed)
Dolor abdominal durante el embarazo  (Abdominal Pain During Pregnancy)  El dolor de vientre (abdominal) es habitual durante el embarazo. Generalmente no se trata de un problema grave. Otras veces puede ser un signo de que algo no anda bien. Siempre comuníquese con su médico si tiene dolor abdominal.  CUIDADOS EN EL HOGAR  Controle el dolor para ver si hay cambios. Las indicaciones que siguen pueden ayudarla a sentirse mejor:  · Notenga sexo (relaciones sexuales) ni se coloque nada dentro de la vagina hasta que se sienta mejor.  · Haga reposo hasta que el dolor se calme.  · Si siente ganas de vomitar (náuseas ) beba líquidos claros. No consuma alimentos sólidos hasta que se sienta mejor.  · Sólo tome los medicamentos que le haya indicado su médico.  · Cumpla con las visitas al médico según las indicaciones.  SOLICITE AYUDA DE INMEDIATO SI:  · Tiene un sangrado, pierde líquido o elimina trozos de tejido por la vagina.  · Siente más dolor o cólicos.  · Comienza a vomitar.  · Siente dolor al orinar u observa sangre en la orina.  · Tiene fiebre.  · No siente que el bebé se mueva mucho.  · Se siente muy débil o cree que va a desmayarse.  · Tiene dificultad para respirar con o sin dolor en el vientre.  · Siente un dolor de cabeza muy intenso y dolor en el vientre.  · Observa que sale un líquido por la vagina y tiene dolor abdominal.  · La materia fecal es líquida (diarrea).  · El dolor en el viente no desaparece, o empeora, luego de hacer reposo.    ASEGÚRESE DE QUE:  · Comprende estas instrucciones.  · Controlará su afección.  · Recibirá ayuda de inmediato si no mejora o si empeora.    Esta información no tiene como fin reemplazar el consejo del médico. Asegúrese de hacerle al médico cualquier pregunta que tenga.  Document Released: 01/15/2011 Document Revised: 08/27/2015 Document Reviewed: 12/02/2012  Elsevier Interactive Patient Education © 2017 Elsevier Inc.

## 2016-05-28 NOTE — MAU Provider Note (Signed)
History     CSN: [id number]  Arrival date and time: 05/28/16 2149   First Provider Initiated Contact with Patient 05/28/16 2237      No chief complaint on file.  HPI Ms Walby is a 25yo [id number] @ 33.2wks who presents for eval of pelvic pressure/heaviness and low back pain since 4pm today. Denies leaking or bldg. Denies ctx, fever, or UTI s/s. Her preg has been followed by the [facility] office and has been essentially unremarkable (records rev'd).  OB History    Gravida Para Term Preterm AB Living   5 2 2   2 2    SAB TAB Ectopic Multiple Live Births     2     2      Past Medical History:  Diagnosis Date  . No pertinent past medical history     Past Surgical History:  Procedure Laterality Date  . WISDOM TOOTH EXTRACTION      Family History  Problem Relation Age of Onset  . Cancer Maternal Grandfather     Social History  Substance Use Topics  . Smoking status: Never Smoker  . Smokeless tobacco: Never Used  . Alcohol use No    Allergies: No Known Allergies  Prescriptions Prior to Admission  Medication Sig Dispense Refill Last Dose  . Prenat w/o A-FeCbn-Meth-FA-DHA (PRENATE MINI) 29-0.6-0.4-350 MG CAPS Take 1 capsule by mouth daily before breakfast. 90 capsule 3 05/28/2016 at Unknown time  . amoxicillin-clavulanate (AUGMENTIN) 875-125 MG tablet Take 1 tablet by mouth 2 (two) times daily. (Patient not taking: Reported on 05/28/2016) 14 tablet 0 Not Taking at Unknown time  . metroNIDAZOLE (FLAGYL) 500 MG tablet Take 1 tablet (500 mg total) by mouth 2 (two) times daily. (Patient not taking: Reported on 02/19/2016) 14 tablet 2 Not Taking  . terconazole (TERAZOL 7) 0.4 % vaginal cream Place 1 applicator vaginally at bedtime. (Patient not taking: Reported on 02/19/2016) 45 g 0 Not Taking    Review of Systems No pertinent positives other than listed in HPI Physical Exam   Blood pressure 109/73, pulse 94, temperature 98.7 F (37.1 C), temperature source Oral, resp. rate 17,  height 5\' 4"  (1.626 m), weight 82.1 kg (181 lb), last menstrual period 10/08/2015, SpO2 100 %.  Physical Exam  Constitutional: She is oriented to person, place, and time. She appears well-developed.  HENT:  Head: Normocephalic.  Neck: Normal range of motion.  Cardiovascular: Normal rate.   Respiratory: Effort normal.  GI:  EFM 150s, +accels, no decels No ctx per toco Abd gravid, soft  Genitourinary:  Genitourinary Comments: Cx C/L  Musculoskeletal: Normal range of motion.  Neurological: She is alert and oriented to person, place, and time.  Skin: Skin is warm and dry.  Psychiatric: She has a normal mood and affect. Her behavior is normal. Thought content normal.   Urinalysis    Component Value Date/Time   COLORURINE STRAW (A) 05/28/2016 2200   APPEARANCEUR CLEAR 05/28/2016 2200   LABSPEC 1.008 05/28/2016 2200   PHURINE 7.0 05/28/2016 2200   GLUCOSEU NEGATIVE 05/28/2016 2200   HGBUR NEGATIVE 05/28/2016 2200   BILIRUBINUR NEGATIVE 05/28/2016 2200   BILIRUBINUR Neg 12/25/2015 1356   KETONESUR NEGATIVE 05/28/2016 2200   PROTEINUR NEGATIVE 05/28/2016 2200   UROBILINOGEN 0.2 12/25/2015 1356   NITRITE NEGATIVE 05/28/2016 2200   LEUKOCYTESUR NEGATIVE 05/28/2016 2200    MAU Course  Procedures  MDM UA ordered  Assessment and Plan  IUP@33 .2wks Pelvic pressure  D/C home with PTL precautions Rec maternity support belt  and other comfort tips F/U as scheduled at next OB appt   Cam HaiSHAW, Denaja Verhoeven CNM 05/28/2016, 10:44 PM

## 2016-05-28 NOTE — MAU Note (Signed)
Pt reports a lot of pressure in her vaginal area since about 4 pm today, difficult to walk.

## 2016-06-01 IMAGING — US US OB DETAIL+14 WK
1 series · 12 of 28 positions shown · non-contrast
Comparison: none

[Series 1: thrombocytopenia · 0.18mm/px · 80 acquisitions, 12 frames shown]
[im 3/80]
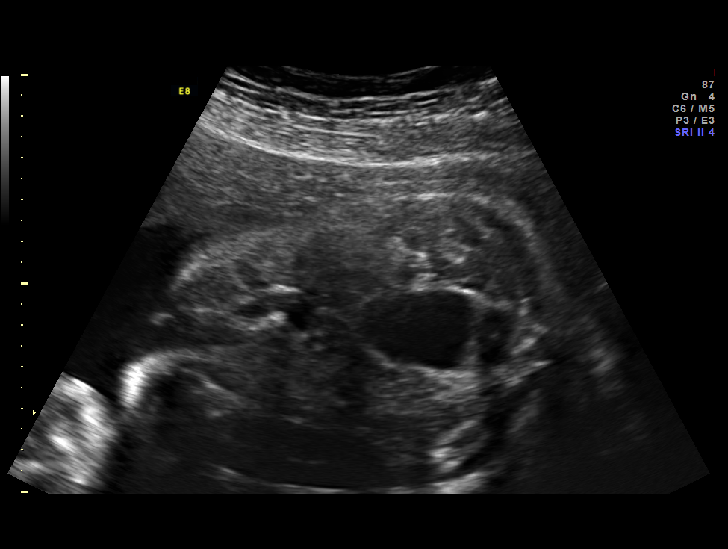
[im 9/80]
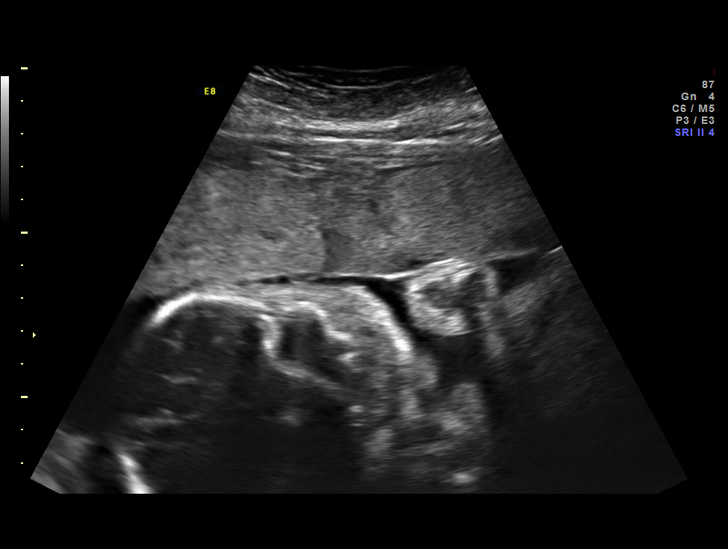
[im 15/80]
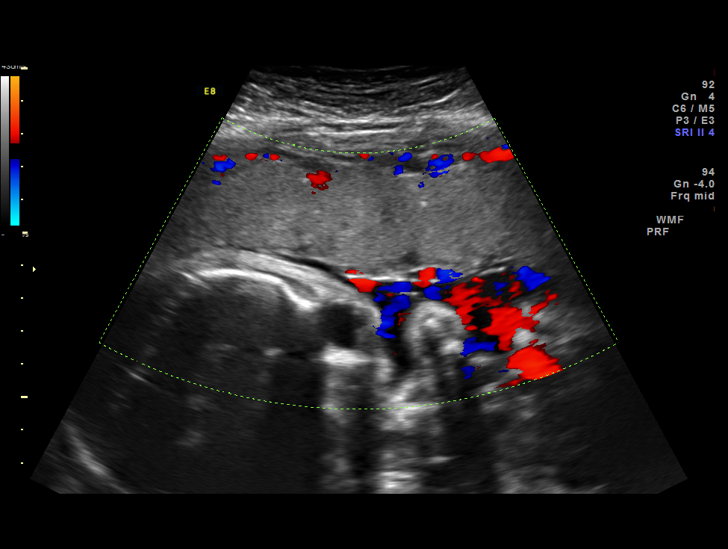
[im 24/80]
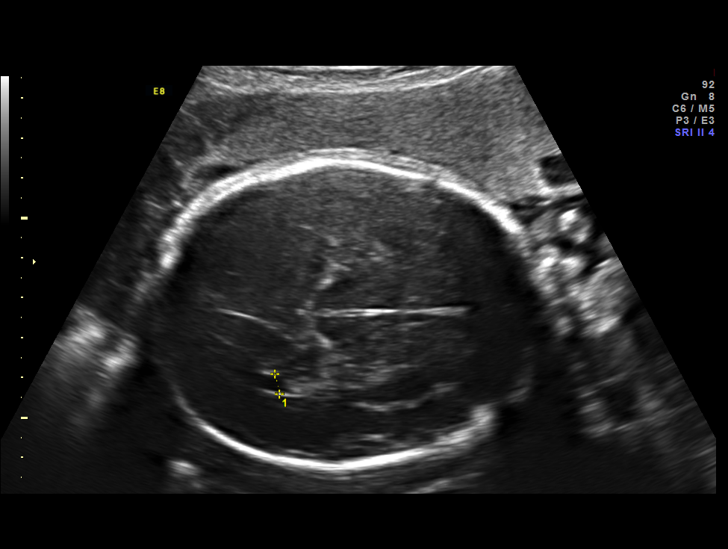
[im 30/80]
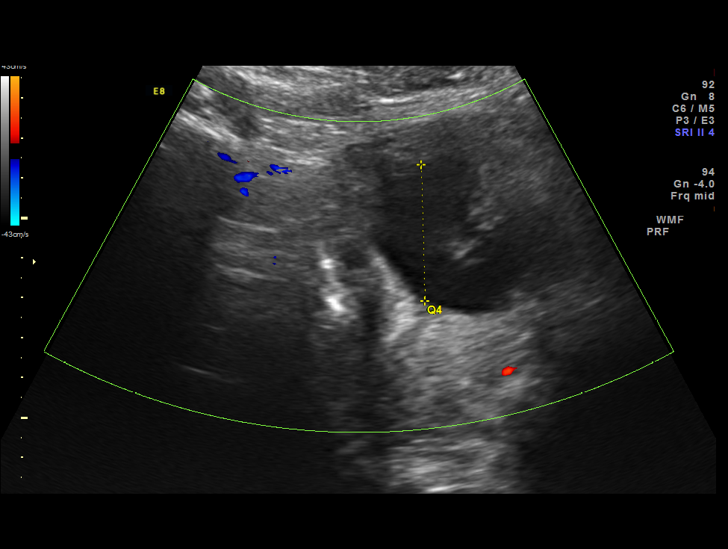
[im 36/80]
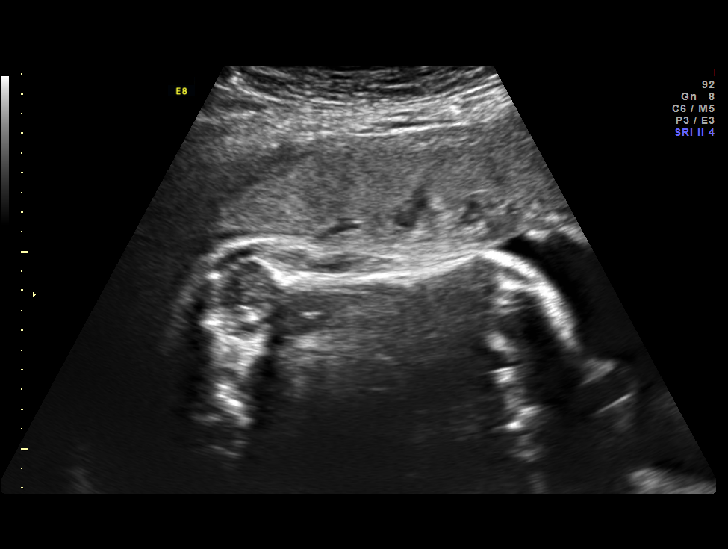
[im 44/80]
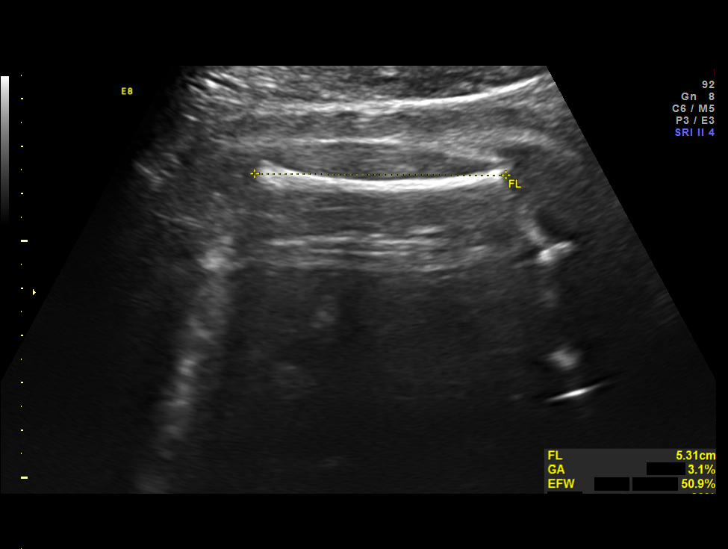
[im 50/80]
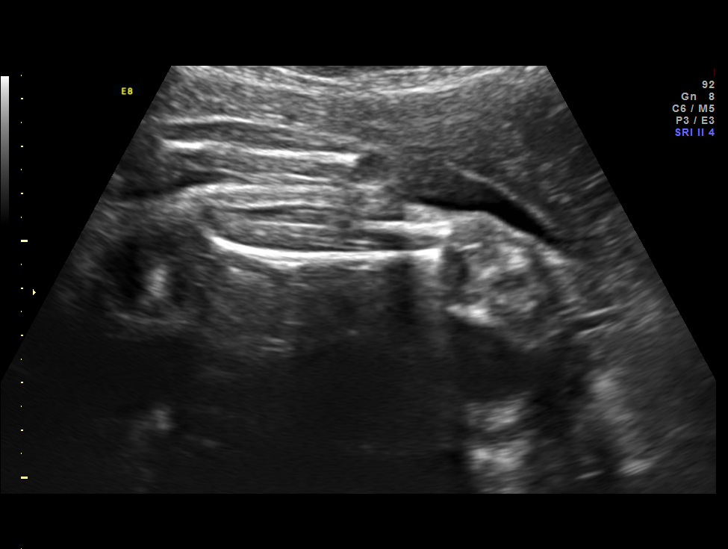
[im 56/80]
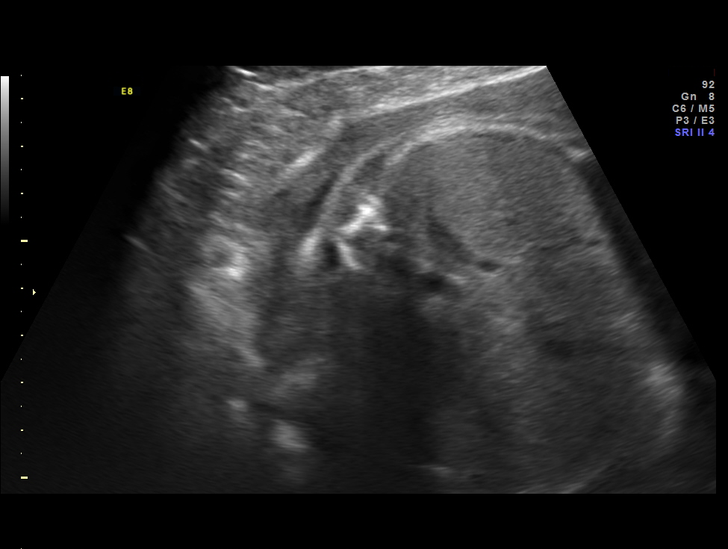
[im 65/80]
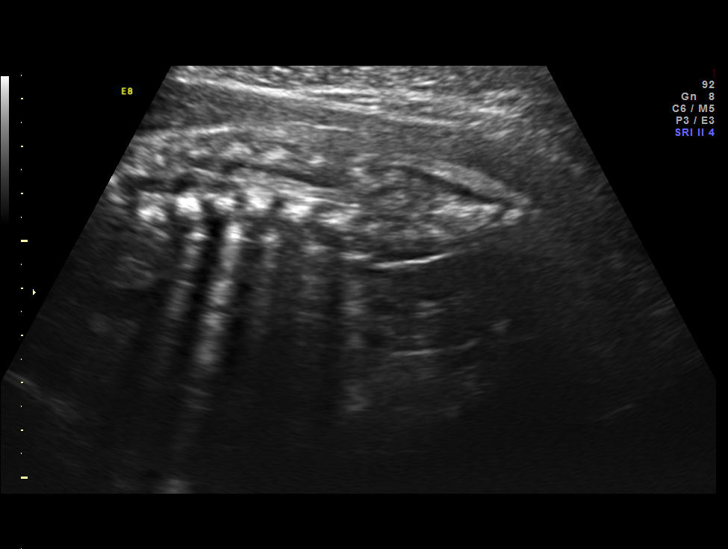
[im 71/80]
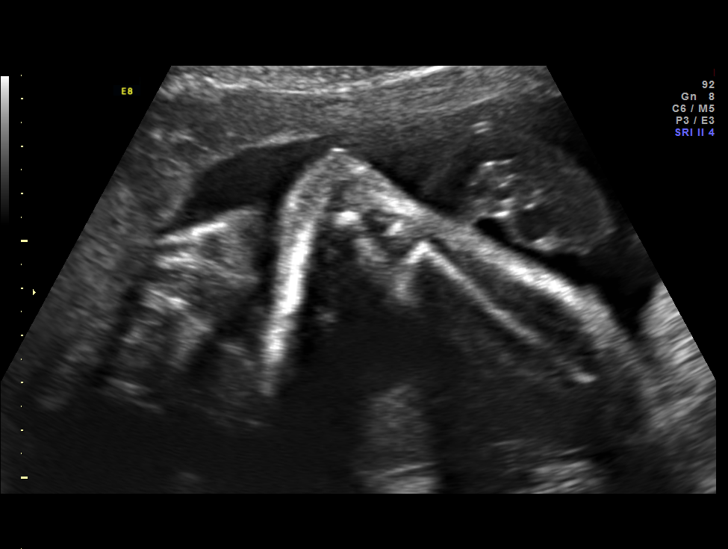
[im 77/80]
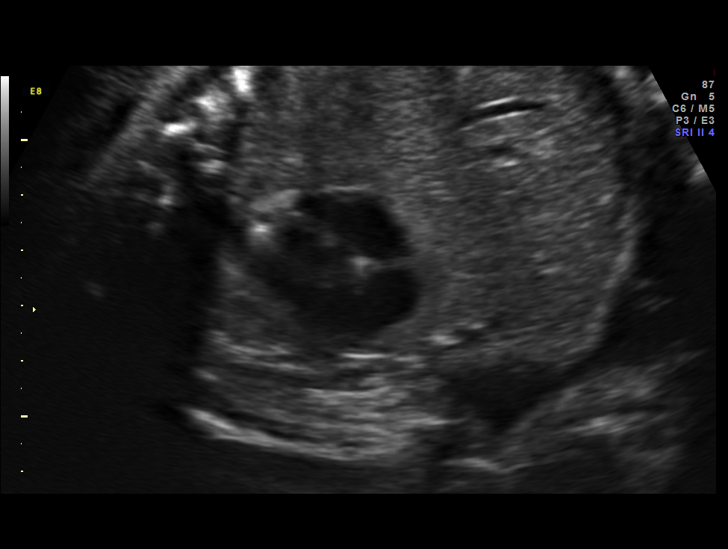

[12 of 28 positions shown; findings below may reference images not displayed]

OBSTETRICS REPORT
                      (Signed Final 12/13/2013 [DATE])

Service(s) Provided

 US OB DETAIL + 14 WK                                  76811.0
Indications

 Thrombocytopenia, idiopathic or autoimmune (ITP)
 Detailed fetal anatomic survey
Fetal Evaluation

 Num Of Fetuses:    1
 Fetal Heart Rate:  135                          bpm
 Cardiac Activity:  Observed
 Presentation:      Breech
 Placenta:          Anterior, above cervical os
 P. Cord            Visualized, central
 Insertion:

 Amniotic Fluid
 AFI FV:      Subjectively within normal limits
 AFI Sum:     14.38   cm       49  %Tile     Larg Pckt:    4.84  cm
 RUQ:   4.84    cm   RLQ:    3.42   cm    LUQ:   3.56    cm   LLQ:    2.56   cm
Biometry

 BPD:     75.7  mm     G. Age:  30w 3d                CI:         74.3   70 - 86
 OFD:    101.9  mm                                    FL/HC:      18.7   19.2 -

 HC:     281.3  mm     G. Age:  30w 6d       34  %    HC/AC:      1.05   0.99 -

 AC:     268.2  mm     G. Age:  30w 6d       68  %    FL/BPD:     69.6   71 - 87
 FL:      52.7  mm     G. Age:  28w 0d      < 3  %    FL/AC:      19.6   20 - 24
 HUM:     51.7  mm     G. Age:  30w 1d       54  %

 Est. FW:    6053  gm      3 lb 4 oz     50  %
Gestational Age

 LMP:           30w 1d        Date:  05/16/13                 EDD:   02/20/14
 U/S Today:     30w 0d                                        EDD:   02/21/14
 Best:          30w 1d     Det. By:  LMP  (05/16/13)          EDD:   02/20/14
Anatomy

 Cranium:          Appears normal         Aortic Arch:      Not well visualized
 Fetal Cavum:      Appears normal         Ductal Arch:      Not well visualized
 Ventricles:       Appears normal         Diaphragm:        Appears normal
 Choroid Plexus:   Not well visualized    Stomach:          Appears normal, left
                                                            sided
 Cerebellum:       Appears normal         Abdomen:          Appears normal
 Posterior Fossa:  Appears normal         Abdominal Wall:   Not well visualized
 Nuchal Fold:      Not applicable (>20    Cord Vessels:     Appears normal (3
                   wks GA)                                  vessel cord)
 Face:             Orbits appear          Kidneys:          Appear normal
                   normal
 Lips:             Appears normal         Bladder:          Appears normal
 Heart:            Not well visualized    Spine:            Appears normal
 RVOT:             Appears normal         Lower             Appears normal
                                          Extremities:
 LVOT:             Appears normal         Upper             Appears normal
                                          Extremities:

 Other:  Fetus appears to be a male. Heels visualized. Complete fetal
         anatomic survey previously performed. Technically difficult due to
         advanced GA and fetal position.
Cervix Uterus Adnexa

 Cervix:       Not visualized (advanced GA >80wks)
 Uterus:       No abnormality visualized.
 Cul De Sac:   No free fluid seen.
 Left Ovary:    Not visualized.
 Right Ovary:   Not visualized.

 Adnexa:     No abnormality visualized.
Comments

 The patient's fetal anatomic survey is not complete due to
 advanced gestational age.  However, no gross fetal
 anomalies were identified.  If complete visualization of fetal
 anatomy is desired please order an additional [facility]
 ultrasoun in 4 weeks.
Impression

 Single living intrauterine pregnancy at 30 weeks 1 day.
 Appropriate fetal growth (50%).
 Normal amniotic fluid volume.
 No gross fetal anomalies identified.
Recommendations

 Follow-up ultrasounds as clinically indicated.

 questions or concerns.
                Wojciechowski, Angely

## 2016-06-17 ENCOUNTER — Ambulatory Visit (INDEPENDENT_AMBULATORY_CARE_PROVIDER_SITE_OTHER): Payer: Medicaid Other | Admitting: Obstetrics

## 2016-06-17 VITALS — BP 110/69 | HR 93 | Wt 184.0 lb

## 2016-06-17 DIAGNOSIS — Z23 Encounter for immunization: Secondary | ICD-10-CM | POA: Diagnosis not present

## 2016-06-17 DIAGNOSIS — Z3480 Encounter for supervision of other normal pregnancy, unspecified trimester: Secondary | ICD-10-CM

## 2016-06-17 DIAGNOSIS — Z3483 Encounter for supervision of other normal pregnancy, third trimester: Secondary | ICD-10-CM

## 2016-06-17 DIAGNOSIS — Z3490 Encounter for supervision of normal pregnancy, unspecified, unspecified trimester: Secondary | ICD-10-CM

## 2016-06-17 NOTE — Progress Notes (Signed)
Patient reports no concerns today 

## 2016-06-18 ENCOUNTER — Encounter: Payer: Self-pay | Admitting: Obstetrics

## 2016-06-18 LAB — OB RESULTS CONSOLE GBS: STREP GROUP B AG: POSITIVE

## 2016-06-18 NOTE — Progress Notes (Signed)
Subjective:  Brittany Vargas is a 26 y.o. [phone] at 2858w2d being seen today for ongoing prenatal care.  She is currently monitored for the following issues for this low-risk pregnancy and has Unsure of LMP (last menstrual period) as reason for ultrasound scan; Thrombocytopenia complicating pregnancy (HCC); GBS (group B Streptococcus carrier), +RV culture, currently pregnant; Personal history of previous postdates pregnancy; Post-dates pregnancy; NSVD (normal spontaneous vaginal delivery); and Supervision of normal pregnancy, antepartum on her problem list.  Patient reports no complaints.  Contractions: Not present. Vag. Bleeding: None.  Movement: Present. Denies leaking of fluid.   The following portions of the patient's history were reviewed and updated as appropriate: allergies, current medications, past family history, past medical history, past social history, past surgical history and problem list. Problem list updated.  Objective:   Vitals:   06/17/16 1356  BP: 110/69  Pulse: 93  Weight: 184 lb (83.5 kg)    Fetal Status: Fetal Heart Rate (bpm): 150   Movement: Present     General:  Alert, oriented and cooperative. Patient is in no acute distress.  Skin: Skin is warm and dry. No rash noted.   Cardiovascular: Normal heart rate noted  Respiratory: Normal respiratory effort, no problems with respiration noted  Abdomen: Soft, gravid, appropriate for gestational age. Pain/Pressure: Absent     Pelvic:  Cervical exam deferred        Extremities: Normal range of motion.  Edema: None  Mental Status: Normal mood and affect. Normal behavior. Normal judgment and thought content.   Urinalysis:      Assessment and Plan:  Pregnancy: [id number] at 1058w2d  1. Pregnancy, unspecified gestational age Labs: - Strep Gp B NAA - Tdap vaccine greater than or equal to 7yo IM - Flu Vaccine QUAD 36+ mos IM (Fluarix, Quad PF)  Preterm labor symptoms and general obstetric precautions including but not  limited to vaginal bleeding, contractions, leaking of fluid and fetal movement were reviewed in detail with the patient. Please refer to After Visit Summary for other counseling recommendations.  No Follow-up on file.   Brock Badharles A Harper, MDPatient ID: Brittany OremAbigail J Vargas, female   DOB: 19-Aug-1990, 26 y.o.   MRN: [id number]

## 2016-06-19 LAB — STREP GP B NAA: STREP GROUP B AG: POSITIVE — AB

## 2016-06-26 ENCOUNTER — Ambulatory Visit (INDEPENDENT_AMBULATORY_CARE_PROVIDER_SITE_OTHER): Payer: Medicaid Other | Admitting: Certified Nurse Midwife

## 2016-06-26 VITALS — BP 104/70 | HR 87 | Wt 182.0 lb

## 2016-06-26 DIAGNOSIS — Z3480 Encounter for supervision of other normal pregnancy, unspecified trimester: Secondary | ICD-10-CM

## 2016-06-26 DIAGNOSIS — D696 Thrombocytopenia, unspecified: Secondary | ICD-10-CM

## 2016-06-26 DIAGNOSIS — O99113 Other diseases of the blood and blood-forming organs and certain disorders involving the immune mechanism complicating pregnancy, third trimester: Secondary | ICD-10-CM

## 2016-06-26 DIAGNOSIS — O99119 Other diseases of the blood and blood-forming organs and certain disorders involving the immune mechanism complicating pregnancy, unspecified trimester: Secondary | ICD-10-CM

## 2016-06-26 NOTE — Patient Instructions (Addendum)
Third Trimester of Pregnancy The third trimester is from week 29 through week 40 (months 7 through 9). The third trimester is a time when the unborn baby (fetus) is growing rapidly. At the end of the ninth month, the fetus is about 20 inches in length and weighs 6-10 pounds. Body changes during your third trimester Your body goes through many changes during pregnancy. The changes vary from woman to woman. During the third trimester:  Your weight will continue to increase. You can expect to gain 25-35 pounds (11-16 kg) by the end of the pregnancy.  You may begin to get stretch marks on your hips, abdomen, and breasts.  You may urinate more often because the fetus is moving lower into your pelvis and pressing on your bladder.  You may develop or continue to have heartburn. This is caused by increased hormones that slow down muscles in the digestive tract.  You may develop or continue to have constipation because increased hormones slow digestion and cause the muscles that push waste through your intestines to relax.  You may develop hemorrhoids. These are swollen veins (varicose veins) in the rectum that can itch or be painful.  You may develop swollen, bulging veins (varicose veins) in your legs.  You may have increased body aches in the pelvis, back, or thighs. This is due to weight gain and increased hormones that are relaxing your joints.  You may have changes in your hair. These can include thickening of your hair, rapid growth, and changes in texture. Some women also have hair loss during or after pregnancy, or hair that feels dry or thin. Your hair will most likely return to normal after your baby is born.  Your breasts will continue to grow and they will continue to become tender. A yellow fluid (colostrum) may leak from your breasts. This is the first milk you are producing for your baby.  Your belly button may stick out.  You may notice more swelling in your hands, face, or  ankles.  You may have increased tingling or numbness in your hands, arms, and legs. The skin on your belly may also feel numb.  You may feel short of breath because of your expanding uterus.  You may have more problems sleeping. This can be caused by the size of your belly, increased need to urinate, and an increase in your body's metabolism.  You may notice the fetus "dropping," or moving lower in your abdomen.  You may have increased vaginal discharge.  Your cervix becomes thin and soft (effaced) near your due date. What to expect at prenatal visits You will have prenatal exams every 2 weeks until week 36. Then you will have weekly prenatal exams. During a routine prenatal visit:  You will be weighed to make sure you and the fetus are growing normally.  Your blood pressure will be taken.  Your abdomen will be measured to track your baby's growth.  The fetal heartbeat will be listened to.  Any test results from the previous visit will be discussed.  You may have a cervical check near your due date to see if you have effaced. At around 36 weeks, your health care provider will check your cervix. At the same time, your health care provider will also perform a test on the secretions of the vaginal tissue. This test is to determine if a type of bacteria, Group B streptococcus, is present. Your health care provider will explain this further. Your health care provider may ask you:    What your birth plan is.  How you are feeling.  If you are feeling the baby move.  If you have had any abnormal symptoms, such as leaking fluid, bleeding, severe headaches, or abdominal cramping.  If you are using any tobacco products, including cigarettes, chewing tobacco, and electronic cigarettes.  If you have any questions. Other tests or screenings that may be performed during your third trimester include:  Blood tests that check for low iron levels (anemia).  Fetal testing to check the health,  activity level, and growth of the fetus. Testing is done if you have certain medical conditions or if there are problems during the pregnancy.  Nonstress test (NST). This test checks the health of your baby to make sure there are no signs of problems, such as the baby not getting enough oxygen. During this test, a belt is placed around your belly. The baby is made to move, and its heart rate is monitored during movement. What is false labor? False labor is a condition in which you feel small, irregular tightenings of the muscles in the womb (contractions) that eventually go away. These are called Braxton Hicks contractions. Contractions may last for hours, days, or even weeks before true labor sets in. If contractions come at regular intervals, become more frequent, increase in intensity, or become painful, you should see your health care provider. What are the signs of labor?  Abdominal cramps.  Regular contractions that start at 10 minutes apart and become stronger and more frequent with time.  Contractions that start on the top of the uterus and spread down to the lower abdomen and back.  Increased pelvic pressure and dull back pain.  A watery or bloody mucus discharge that comes from the vagina.  Leaking of amniotic fluid. This is also known as your "water breaking." It could be a slow trickle or a gush. Let your doctor know if it has a color or strange odor. If you have any of these signs, call your health care provider right away, even if it is before your due date. Follow these instructions at home: Eating and drinking  Continue to eat regular, healthy meals.  Do not eat:  Raw meat or meat spreads.  Unpasteurized milk or cheese.  Unpasteurized juice.  Store-made salad.  Refrigerated smoked seafood.  Hot dogs or deli meat, unless they are piping hot.  More than 6 ounces of albacore tuna a week.  Shark, swordfish, king mackerel, or tile fish.  Store-made salads.  Raw  sprouts, such as mung bean or alfalfa sprouts.  Take prenatal vitamins as told by your health care provider.  Take 1000 mg of calcium daily as told by your health care provider.  If you develop constipation:  Take over-the-counter or prescription medicines.  Drink enough fluid to keep your urine clear or pale yellow.  Eat foods that are high in fiber, such as fresh fruits and vegetables, whole grains, and beans.  Limit foods that are high in fat and processed sugars, such as fried and sweet foods. Activity  Exercise only as directed by your health care provider. Healthy pregnant women should aim for 2 hours and 30 minutes of moderate exercise per week. If you experience any pain or discomfort while exercising, stop.  Avoid heavy lifting.  Do not exercise in extreme heat or humidity, or at high altitudes.  Wear low-heel, comfortable shoes.  Practice good posture.  Do not travel far distances unless it is absolutely necessary and only with the approval   of your health care provider.  Wear your seat belt at all times while in a car, on a bus, or on a plane.  Take frequent breaks and rest with your legs elevated if you have leg cramps or low back pain.  Do not use hot tubs, steam rooms, or saunas.  You may continue to have sex unless your health care provider tells you otherwise. Lifestyle  Do not use any products that contain nicotine or tobacco, such as cigarettes and e-cigarettes. If you need help quitting, ask your health care provider.  Do not drink alcohol.  Do not use any medicinal herbs or unprescribed drugs. These chemicals affect the formation and growth of the baby.  If you develop varicose veins:  Wear support pantyhose or compression stockings as told by your healthcare provider.  Elevate your feet for 15 minutes, 3-4 times a day.  Wear a supportive maternity bra to help with breast tenderness. General instructions  Take over-the-counter and prescription  medicines only as told by your health care provider. There are medicines that are either safe or unsafe to take during pregnancy.  Take warm sitz baths to soothe any pain or discomfort caused by hemorrhoids. Use hemorrhoid cream or witch hazel if your health care provider approves.  Avoid cat litter boxes and soil used by cats. These carry germs that can cause birth defects in the baby. If you have a cat, ask someone to clean the litter box for you.  To prepare for the arrival of your baby:  Take prenatal classes to understand, practice, and ask questions about the labor and delivery.  Make a trial run to the hospital.  Visit the hospital and tour the maternity area.  Arrange for maternity or paternity leave through employers.  Arrange for family and friends to take care of pets while you are in the hospital.  Purchase a rear-facing car seat and make sure you know how to install it in your car.  Pack your hospital bag.  Prepare the baby's nursery. Make sure to remove all pillows and stuffed animals from the baby's crib to prevent suffocation.  Visit your dentist if you have not gone during your pregnancy. Use a soft toothbrush to brush your teeth and be gentle when you floss.  Keep all prenatal follow-up visits as told by your health care provider. This is important. Contact a health care provider if:  You are unsure if you are in labor or if your water has broken.  You become dizzy.  You have mild pelvic cramps, pelvic pressure, or nagging pain in your abdominal area.  You have lower back pain.  You have persistent nausea, vomiting, or diarrhea.  You have an unusual or bad smelling vaginal discharge.  You have pain when you urinate. Get help right away if:  You have a fever.  You are leaking fluid from your vagina.  You have spotting or bleeding from your vagina.  You have severe abdominal pain or cramping.  You have rapid weight loss or weight gain.  You have  shortness of breath with chest pain.  You notice sudden or extreme swelling of your face, hands, ankles, feet, or legs.  Your baby makes fewer than 10 movements in 2 hours.  You have severe headaches that do not go away with medicine.  You have vision changes. Summary  The third trimester is from week 29 through week 40, months 7 through 9. The third trimester is a time when the unborn baby (fetus)   is growing rapidly.  During the third trimester, your discomfort may increase as you and your baby continue to gain weight. You may have abdominal, leg, and back pain, sleeping problems, and an increased need to urinate.  During the third trimester your breasts will keep growing and they will continue to become tender. A yellow fluid (colostrum) may leak from your breasts. This is the first milk you are producing for your baby.  False labor is a condition in which you feel small, irregular tightenings of the muscles in the womb (contractions) that eventually go away. These are called Braxton Hicks contractions. Contractions may last for hours, days, or even weeks before true labor sets in.  Signs of labor can include: abdominal cramps; regular contractions that start at 10 minutes apart and become stronger and more frequent with time; watery or bloody mucus discharge that comes from the vagina; increased pelvic pressure and dull back pain; and leaking of amniotic fluid. This information is not intended to replace advice given to you by your health care provider. Make sure you discuss any questions you have with your health care provider. Document Released: 04/29/2001 Document Revised: 10/11/2015 Document Reviewed: 07/06/2012 Elsevier Interactive Patient Education  2017 [employer]  

## 2016-06-26 NOTE — Progress Notes (Signed)
   PRENATAL VISIT NOTE  Subjective:  Brittany Vargas is a 26 y.o. [id number] at 2913w3d being seen today for ongoing prenatal care.  She is currently monitored for the following issues for this low-risk pregnancy and has Thrombocytopenia complicating pregnancy (HCC); GBS (group B Streptococcus carrier), +RV culture, currently pregnant; Personal history of previous postdates pregnancy; and Supervision of normal pregnancy, antepartum on her problem list.  Patient reports no complaints.  Contractions: Not present. Vag. Bleeding: None.  Movement: Present. Denies leaking of fluid.   The following portions of the patient's history were reviewed and updated as appropriate: allergies, current medications, past family history, past medical history, past social history, past surgical history and problem list. Problem list updated.  Objective:   Vitals:   06/26/16 1610  BP: 104/70  Pulse: 87  Weight: 182 lb (82.6 kg)    Fetal Status: Fetal Heart Rate (bpm): 140 Fundal Height: 38 cm Movement: Present     General:  Alert, oriented and cooperative. Patient is in no acute distress.  Skin: Skin is warm and dry. No rash noted.   Cardiovascular: Normal heart rate noted  Respiratory: Normal respiratory effort, no problems with respiration noted  Abdomen: Soft, gravid, appropriate for gestational age. Pain/Pressure: Absent     Pelvic:  Cervical exam deferred        Extremities: Normal range of motion.     Mental Status: Normal mood and affect. Normal behavior. Normal judgment and thought content.   Assessment and Plan:  Pregnancy: [id number] at 7113w3d  1. Supervision of other normal pregnancy, antepartum      Doing well  2. Thrombocytopenia complicating pregnancy (HCC)      Last blood count 2 mo. 137  Term labor symptoms and general obstetric precautions including but not limited to vaginal bleeding, contractions, leaking of fluid and fetal movement were reviewed in detail with the patient. Please refer  to After Visit Summary for other counseling recommendations.  No Follow-up on file.   Roe Coombsachelle A Brandyn Thien, CNM

## 2016-07-03 ENCOUNTER — Ambulatory Visit (INDEPENDENT_AMBULATORY_CARE_PROVIDER_SITE_OTHER): Payer: Medicaid Other | Admitting: Certified Nurse Midwife

## 2016-07-03 VITALS — BP 110/70 | HR 87 | Wt 186.0 lb

## 2016-07-03 DIAGNOSIS — D696 Thrombocytopenia, unspecified: Secondary | ICD-10-CM

## 2016-07-03 DIAGNOSIS — Z3483 Encounter for supervision of other normal pregnancy, third trimester: Secondary | ICD-10-CM

## 2016-07-03 DIAGNOSIS — O99820 Streptococcus B carrier state complicating pregnancy: Secondary | ICD-10-CM

## 2016-07-03 DIAGNOSIS — O99113 Other diseases of the blood and blood-forming organs and certain disorders involving the immune mechanism complicating pregnancy, third trimester: Secondary | ICD-10-CM

## 2016-07-03 DIAGNOSIS — O99119 Other diseases of the blood and blood-forming organs and certain disorders involving the immune mechanism complicating pregnancy, unspecified trimester: Secondary | ICD-10-CM

## 2016-07-03 DIAGNOSIS — Z3480 Encounter for supervision of other normal pregnancy, unspecified trimester: Secondary | ICD-10-CM

## 2016-07-03 NOTE — Progress Notes (Signed)
   PRENATAL VISIT NOTE  Subjective:  Brittany Vargas is a 26 y.o. [id number] at 1427w3d being seen today for ongoing prenatal care.  She is currently monitored for the following issues for this low-risk pregnancy and has Thrombocytopenia complicating pregnancy (HCC); GBS (group B Streptococcus carrier), +RV culture, currently pregnant; Personal history of previous postdates pregnancy; and Supervision of normal pregnancy, antepartum on her problem list.  Patient reports no complaints.  Contractions: Irregular. Vag. Bleeding: None.  Movement: Present. Denies leaking of fluid.   The following portions of the patient's history were reviewed and updated as appropriate: allergies, current medications, past family history, past medical history, past social history, past surgical history and problem list. Problem list updated.  Objective:   Vitals:   07/03/16 1502  BP: 110/70  Pulse: 87  Weight: 186 lb (84.4 kg)    Fetal Status: Fetal Heart Rate (bpm): 142 Fundal Height: 39 cm Movement: Present  Presentation: Vertex  General:  Alert, oriented and cooperative. Patient is in no acute distress.  Skin: Skin is warm and dry. No rash noted.   Cardiovascular: Normal heart rate noted  Respiratory: Normal respiratory effort, no problems with respiration noted  Abdomen: Soft, gravid, appropriate for gestational age. Pain/Pressure: Absent     Pelvic:  Cervical exam performed Dilation: 1 Effacement (%): 0 Station: -3  Extremities: Normal range of motion.     Mental Status: Normal mood and affect. Normal behavior. Normal judgment and thought content.   Assessment and Plan:  Pregnancy: [id number] at 5927w3d  1. Supervision of other normal pregnancy, antepartum     Doing well.   2. GBS (group B Streptococcus carrier), +RV culture, currently pregnant     PCN for delivery  3. Thrombocytopenia complicating pregnancy (HCC)      Plts at labor if desires epidural.  No repeat platelets today per Dr. Debroah LoopArnold.    Term labor symptoms and general obstetric precautions including but not limited to vaginal bleeding, contractions, leaking of fluid and fetal movement were reviewed in detail with the patient. Please refer to After Visit Summary for other counseling recommendations.  Return in about 1 week (around 07/10/2016) for ROB.   Roe Coombsachelle A Onalee Steinbach, CNM

## 2016-07-14 ENCOUNTER — Other Ambulatory Visit: Payer: Self-pay | Admitting: Certified Nurse Midwife

## 2016-07-14 ENCOUNTER — Ambulatory Visit (INDEPENDENT_AMBULATORY_CARE_PROVIDER_SITE_OTHER): Payer: Medicaid Other | Admitting: Certified Nurse Midwife

## 2016-07-14 VITALS — BP 108/70 | HR 84

## 2016-07-14 DIAGNOSIS — O99113 Other diseases of the blood and blood-forming organs and certain disorders involving the immune mechanism complicating pregnancy, third trimester: Secondary | ICD-10-CM

## 2016-07-14 DIAGNOSIS — Z3480 Encounter for supervision of other normal pregnancy, unspecified trimester: Secondary | ICD-10-CM

## 2016-07-14 DIAGNOSIS — D696 Thrombocytopenia, unspecified: Secondary | ICD-10-CM

## 2016-07-14 DIAGNOSIS — Z8759 Personal history of other complications of pregnancy, childbirth and the puerperium: Secondary | ICD-10-CM

## 2016-07-14 DIAGNOSIS — O99820 Streptococcus B carrier state complicating pregnancy: Secondary | ICD-10-CM

## 2016-07-14 DIAGNOSIS — O99119 Other diseases of the blood and blood-forming organs and certain disorders involving the immune mechanism complicating pregnancy, unspecified trimester: Principal | ICD-10-CM

## 2016-07-14 NOTE — Progress Notes (Signed)
   PRENATAL VISIT NOTE  Subjective:  Brittany Vargas is a 26 y.o. [id number] at 2827w0d being seen today for ongoing prenatal care.  She is currently monitored for the following issues for this low-risk pregnancy and has Thrombocytopenia complicating pregnancy (HCC); GBS (group B Streptococcus carrier), +RV culture, currently pregnant; Personal history of previous postdates pregnancy; and Supervision of normal pregnancy, antepartum on her problem list.  Patient reports backache, no bleeding, no cramping, no leaking and occasional contractions.  Contractions: Not present. Vag. Bleeding: None.  Movement: Present. Denies leaking of fluid.   The following portions of the patient's history were reviewed and updated as appropriate: allergies, current medications, past family history, past medical history, past social history, past surgical history and problem list. Problem list updated.  Objective:   Vitals:   07/14/16 1349  BP: 108/70  Pulse: 84    Fetal Status: Fetal Heart Rate (bpm): NST; reactive Fundal Height: 40 cm Movement: Present  Presentation: Vertex  General:  Alert, oriented and cooperative. Patient is in no acute distress.  Skin: Skin is warm and dry. No rash noted.   Cardiovascular: Normal heart rate noted  Respiratory: Normal respiratory effort, no problems with respiration noted  Abdomen: Soft, gravid, appropriate for gestational age. Pain/Pressure: Absent     Pelvic:  Cervical exam performed Dilation: 2 Effacement (%): 50 Station: -3  Extremities: Normal range of motion.     Mental Status: Normal mood and affect. Normal behavior. Normal judgment and thought content.   Assessment and Plan:  Pregnancy: L2G4010G5P2022 at 2827w0d  1. Supervision of other normal pregnancy, antepartum      Due date today: reactive NST      NST: + accels, no decels, moderate variability, Cat. 1 tracing. No contractions on toco.   2. Thrombocytopenia complicating pregnancy (HCC)      December: 137.   3.  GBS (group B Streptococcus carrier), +RV culture, currently pregnant     PCN for delivery  4. Personal history of previous postdates pregnancy     IOL scheduled for 07/23/26  Term labor symptoms and general obstetric precautions including but not limited to vaginal bleeding, contractions, leaking of fluid and fetal movement were reviewed in detail with the patient. Please refer to After Visit Summary for other counseling recommendations.  Return in about 4 weeks (around 08/11/2016) for Postpartum.   Roe Coombsachelle A Diarra Ceja, CNM

## 2016-07-15 ENCOUNTER — Inpatient Hospital Stay (HOSPITAL_COMMUNITY)
Admission: AD | Admit: 2016-07-15 | Discharge: 2016-07-17 | DRG: 775 | Disposition: A | Discharge status: Home / Self Care | Payer: Medicaid Other | Source: Ambulatory Visit | Attending: Family Medicine | Admitting: Family Medicine

## 2016-07-15 ENCOUNTER — Inpatient Hospital Stay (HOSPITAL_COMMUNITY): Payer: Medicaid Other | Admitting: Anesthesiology

## 2016-07-15 ENCOUNTER — Encounter (HOSPITAL_COMMUNITY): Payer: Self-pay

## 2016-07-15 DIAGNOSIS — E669 Obesity, unspecified: Secondary | ICD-10-CM | POA: Diagnosis present

## 2016-07-15 DIAGNOSIS — Z3A40 40 weeks gestation of pregnancy: Secondary | ICD-10-CM

## 2016-07-15 DIAGNOSIS — D696 Thrombocytopenia, unspecified: Secondary | ICD-10-CM | POA: Diagnosis present

## 2016-07-15 DIAGNOSIS — O99214 Obesity complicating childbirth: Secondary | ICD-10-CM | POA: Diagnosis present

## 2016-07-15 DIAGNOSIS — O99824 Streptococcus B carrier state complicating childbirth: Secondary | ICD-10-CM | POA: Diagnosis present

## 2016-07-15 DIAGNOSIS — Z6832 Body mass index (BMI) 32.0-32.9, adult: Secondary | ICD-10-CM

## 2016-07-15 DIAGNOSIS — K219 Gastro-esophageal reflux disease without esophagitis: Secondary | ICD-10-CM | POA: Diagnosis present

## 2016-07-15 DIAGNOSIS — O9962 Diseases of the digestive system complicating childbirth: Secondary | ICD-10-CM | POA: Diagnosis present

## 2016-07-15 DIAGNOSIS — O9912 Other diseases of the blood and blood-forming organs and certain disorders involving the immune mechanism complicating childbirth: Principal | ICD-10-CM | POA: Diagnosis present

## 2016-07-15 DIAGNOSIS — Z3480 Encounter for supervision of other normal pregnancy, unspecified trimester: Secondary | ICD-10-CM

## 2016-07-15 DIAGNOSIS — Z3493 Encounter for supervision of normal pregnancy, unspecified, third trimester: Secondary | ICD-10-CM | POA: Diagnosis present

## 2016-07-15 HISTORY — DX: Thrombocytopenia, unspecified: D69.6

## 2016-07-15 LAB — TYPE AND SCREEN
ABO/RH(D): O POS
Antibody Screen: NEGATIVE

## 2016-07-15 LAB — CBC
HCT: 37.4 % (ref 36.0–46.0)
Hemoglobin: 12.8 g/dL (ref 12.0–15.0)
MCH: 31.4 pg (ref 26.0–34.0)
MCHC: 34.2 g/dL (ref 30.0–36.0)
MCV: 91.9 fL (ref 78.0–100.0)
PLATELETS: 131 10*3/uL — AB (ref 150–400)
RBC: 4.07 MIL/uL (ref 3.87–5.11)
RDW: 13.5 % (ref 11.5–15.5)
WBC: 11.9 10*3/uL — ABNORMAL HIGH (ref 4.0–10.5)

## 2016-07-15 MED ORDER — FENTANYL CITRATE (PF) 100 MCG/2ML IJ SOLN
100.0000 ug | INTRAMUSCULAR | Status: DC | PRN
  Administered 2016-07-15 (×2): 100 ug via INTRAVENOUS
  Filled 2016-07-15 (×2): qty 2

## 2016-07-15 MED ORDER — OXYCODONE-ACETAMINOPHEN 5-325 MG PO TABS: 1.0000 | ORAL_TABLET | ORAL | Status: DC | PRN

## 2016-07-15 MED ORDER — PRENATAL MULTIVITAMIN CH
1.0000 | ORAL_TABLET | Freq: Every day | ORAL | Status: DC
  Administered 2016-07-16 – 2016-07-17 (×2): 1 via ORAL
  Filled 2016-07-15 (×2): qty 1

## 2016-07-15 MED ORDER — LACTATED RINGERS IV SOLN
500.0000 mL | Freq: Once | INTRAVENOUS | Status: AC
  Administered 2016-07-15: 500 mL via INTRAVENOUS

## 2016-07-15 MED ORDER — EPHEDRINE 5 MG/ML INJ: 10.0000 mg | INTRAVENOUS | Status: DC | PRN

## 2016-07-15 MED ORDER — SOD CITRATE-CITRIC ACID 500-334 MG/5ML PO SOLN: 30.0000 mL | ORAL | Status: DC | PRN

## 2016-07-15 MED ORDER — OXYTOCIN 40 UNITS IN LACTATED RINGERS INFUSION - SIMPLE MED: 2.5000 [IU]/h | INTRAVENOUS | Status: DC

## 2016-07-15 MED ORDER — DIPHENHYDRAMINE HCL 25 MG PO CAPS: 25.0000 mg | ORAL_CAPSULE | Freq: Four times a day (QID) | ORAL | Status: DC | PRN

## 2016-07-15 MED ORDER — PHENYLEPHRINE 40 MCG/ML (10ML) SYRINGE FOR IV PUSH (FOR BLOOD PRESSURE SUPPORT): 80.0000 ug | PREFILLED_SYRINGE | INTRAVENOUS | Status: DC | PRN

## 2016-07-15 MED ORDER — WITCH HAZEL-GLYCERIN EX PADS: 1.0000 "application " | MEDICATED_PAD | CUTANEOUS | Status: DC | PRN

## 2016-07-15 MED ORDER — LIDOCAINE HCL (PF) 1 % IJ SOLN
30.0000 mL | INTRAMUSCULAR | Status: DC | PRN
  Filled 2016-07-15: qty 30

## 2016-07-15 MED ORDER — MISOPROSTOL 200 MCG PO TABS
600.0000 ug | ORAL_TABLET | Freq: Once | ORAL | Status: AC
  Administered 2016-07-15: 600 ug via ORAL

## 2016-07-15 MED ORDER — IBUPROFEN 600 MG PO TABS
600.0000 mg | ORAL_TABLET | Freq: Four times a day (QID) | ORAL | Status: DC
  Administered 2016-07-15 – 2016-07-17 (×8): 600 mg via ORAL
  Filled 2016-07-15 (×8): qty 1

## 2016-07-15 MED ORDER — EPHEDRINE 5 MG/ML INJ
10.0000 mg | INTRAVENOUS | Status: DC | PRN
  Filled 2016-07-15: qty 4

## 2016-07-15 MED ORDER — SIMETHICONE 80 MG PO CHEW: 80.0000 mg | CHEWABLE_TABLET | ORAL | Status: DC | PRN

## 2016-07-15 MED ORDER — TERBUTALINE SULFATE 1 MG/ML IJ SOLN
0.2500 mg | Freq: Once | INTRAMUSCULAR | Status: DC | PRN
  Filled 2016-07-15: qty 1

## 2016-07-15 MED ORDER — FLEET ENEMA 7-19 GM/118ML RE ENEM: 1.0000 | ENEMA | RECTAL | Status: DC | PRN

## 2016-07-15 MED ORDER — SENNOSIDES-DOCUSATE SODIUM 8.6-50 MG PO TABS
2.0000 | ORAL_TABLET | ORAL | Status: DC
  Administered 2016-07-15 – 2016-07-16 (×2): 2 via ORAL
  Filled 2016-07-15 (×2): qty 2

## 2016-07-15 MED ORDER — PENICILLIN G POTASSIUM 5000000 UNITS IJ SOLR
5.0000 10*6.[IU] | Freq: Once | INTRAVENOUS | Status: AC
  Administered 2016-07-15: 5 10*6.[IU] via INTRAVENOUS
  Filled 2016-07-15: qty 5

## 2016-07-15 MED ORDER — PENICILLIN G POT IN DEXTROSE 60000 UNIT/ML IV SOLN
3.0000 10*6.[IU] | INTRAVENOUS | Status: DC
  Administered 2016-07-15 (×2): 3 10*6.[IU] via INTRAVENOUS
  Filled 2016-07-15 (×5): qty 50

## 2016-07-15 MED ORDER — COCONUT OIL OIL: 1.0000 "application " | TOPICAL_OIL | Status: DC | PRN

## 2016-07-15 MED ORDER — LACTATED RINGERS IV SOLN
INTRAVENOUS | Status: DC
  Administered 2016-07-15: 06:00:00 via INTRAVENOUS

## 2016-07-15 MED ORDER — OXYTOCIN BOLUS FROM INFUSION: 500.0000 mL | Freq: Once | INTRAVENOUS | Status: DC

## 2016-07-15 MED ORDER — OXYCODONE-ACETAMINOPHEN 5-325 MG PO TABS: 2.0000 | ORAL_TABLET | ORAL | Status: DC | PRN

## 2016-07-15 MED ORDER — DIBUCAINE 1 % RE OINT: 1.0000 "application " | TOPICAL_OINTMENT | RECTAL | Status: DC | PRN

## 2016-07-15 MED ORDER — ONDANSETRON HCL 4 MG/2ML IJ SOLN: 4.0000 mg | INTRAMUSCULAR | Status: DC | PRN

## 2016-07-15 MED ORDER — PHENYLEPHRINE 40 MCG/ML (10ML) SYRINGE FOR IV PUSH (FOR BLOOD PRESSURE SUPPORT)
80.0000 ug | PREFILLED_SYRINGE | INTRAVENOUS | Status: DC | PRN
  Filled 2016-07-15: qty 5

## 2016-07-15 MED ORDER — ACETAMINOPHEN 325 MG PO TABS: 650.0000 mg | ORAL_TABLET | ORAL | Status: DC | PRN

## 2016-07-15 MED ORDER — OXYTOCIN 40 UNITS IN LACTATED RINGERS INFUSION - SIMPLE MED
1.0000 m[IU]/min | INTRAVENOUS | Status: DC
  Administered 2016-07-15: 2 m[IU]/min via INTRAVENOUS
  Filled 2016-07-15: qty 1000

## 2016-07-15 MED ORDER — ONDANSETRON HCL 4 MG/2ML IJ SOLN: 4.0000 mg | Freq: Four times a day (QID) | INTRAMUSCULAR | Status: DC | PRN

## 2016-07-15 MED ORDER — ZOLPIDEM TARTRATE 5 MG PO TABS: 5.0000 mg | ORAL_TABLET | Freq: Every evening | ORAL | Status: DC | PRN

## 2016-07-15 MED ORDER — MISOPROSTOL 200 MCG PO TABS
ORAL_TABLET | ORAL | Status: AC
  Filled 2016-07-15: qty 3

## 2016-07-15 MED ORDER — DIPHENHYDRAMINE HCL 50 MG/ML IJ SOLN: 12.5000 mg | INTRAMUSCULAR | Status: DC | PRN

## 2016-07-15 MED ORDER — LIDOCAINE HCL (PF) 1 % IJ SOLN
INTRAMUSCULAR | Status: DC | PRN
  Administered 2016-07-15 (×2): 4 mL via EPIDURAL

## 2016-07-15 MED ORDER — FENTANYL 2.5 MCG/ML BUPIVACAINE 1/10 % EPIDURAL INFUSION (WH - ANES)
14.0000 mL/h | INTRAMUSCULAR | Status: DC | PRN
  Administered 2016-07-15: 14 mL/h via EPIDURAL
  Filled 2016-07-15: qty 100

## 2016-07-15 MED ORDER — ONDANSETRON HCL 4 MG PO TABS: 4.0000 mg | ORAL_TABLET | ORAL | Status: DC | PRN

## 2016-07-15 MED ORDER — TETANUS-DIPHTH-ACELL PERTUSSIS 5-2.5-18.5 LF-MCG/0.5 IM SUSP: 0.5000 mL | Freq: Once | INTRAMUSCULAR | Status: DC

## 2016-07-15 MED ORDER — PHENYLEPHRINE 40 MCG/ML (10ML) SYRINGE FOR IV PUSH (FOR BLOOD PRESSURE SUPPORT)
80.0000 ug | PREFILLED_SYRINGE | INTRAVENOUS | Status: DC | PRN
  Filled 2016-07-15: qty 10

## 2016-07-15 MED ORDER — LACTATED RINGERS IV SOLN: 500.0000 mL | Freq: Once | INTRAVENOUS | Status: DC

## 2016-07-15 MED ORDER — LACTATED RINGERS IV SOLN: 500.0000 mL | INTRAVENOUS | Status: DC | PRN

## 2016-07-15 MED ORDER — BENZOCAINE-MENTHOL 20-0.5 % EX AERO: 1.0000 "application " | INHALATION_SPRAY | CUTANEOUS | Status: DC | PRN

## 2016-07-15 NOTE — Progress Notes (Signed)
Notified of pt arrival in MAU and exam with cervical change after 1 hour and discomfort. Will admit to labor and delivery.

## 2016-07-15 NOTE — Anesthesia Procedure Notes (Signed)
Epidural Patient location during procedure: OB Start time: 07/15/2016 10:01 AM  Staffing Anesthesiologist: Mal AmabileFOSTER, Haniah Penny Performed: anesthesiologist   Preanesthetic Checklist Completed: patient identified, site marked, surgical consent, pre-op evaluation, timeout performed, IV checked, risks and benefits discussed and monitors and equipment checked  Epidural Patient position: sitting Prep: site prepped and draped and DuraPrep Patient monitoring: continuous pulse ox and blood pressure Approach: midline Location: L3-L4 Injection technique: LOR air  Needle:  Needle type: Tuohy  Needle gauge: 17 G Needle length: 9 cm and 9 Needle insertion depth: 5 cm cm Catheter type: closed end flexible Catheter size: 19 Gauge Catheter at skin depth: 10 cm Test dose: negative and Other  Assessment Events: blood not aspirated, injection not painful, no injection resistance, negative IV test and no paresthesia  Additional Notes Patient identified. Risks and benefits discussed including failed block, incomplete  Pain control, post dural puncture headache, nerve damage, paralysis, blood pressure Changes, nausea, vomiting, reactions to medications-both toxic and allergic and post Partum back pain. All questions were answered. Patient expressed understanding and wished to proceed. Sterile technique was used throughout procedure. Epidural site was Dressed with sterile barrier dressing. No paresthesias, signs of intravascular injection Or signs of intrathecal spread were encountered.  Patient was more comfortable after the epidural was dosed. Please see RN's note for documentation of vital signs and FHR which are stable.

## 2016-07-15 NOTE — MAU Note (Signed)
Pt presents complaining of contractions every 10 minutes and bloody show. 2cm in office yesterday. Denies leaking. Reports good fetal movement.

## 2016-07-15 NOTE — H&P (Signed)
LABOR ADMISSION HISTORY AND PHYSICAL  Brittany Vargas is a 26 y.o. female [phone] with IUP at [redacted]w[redacted]d by LMP c/w 19 wk sono presenting for SOL. Reports ctx started at 2300 on 2/26.  She reports +FMs, No LOF, no VB, no blurry vision, headaches or peripheral edema, and RUQ pain.  She plans on breast feeding. She request IUD for birth control.  Dating: By LMP --->  Estimated Date of Delivery: 07/14/16  Prenatal History/Complications: Thrombocytopenia   Past Medical History: Past Medical History:  Diagnosis Date  . Thrombocytopenia (HCC)     Past Surgical History: Past Surgical History:  Procedure Laterality Date  . WISDOM TOOTH EXTRACTION      Obstetrical History: OB History    Gravida Para Term Preterm AB Living   5 2 2   2 2    SAB TAB Ectopic Multiple Live Births     2     2      Social History: Social History   Social History  . Marital status: Single    Spouse name: N/A  . Number of children: N/A  . Years of education: N/A   Social History Main Topics  . Smoking status: Never Smoker  . Smokeless tobacco: Never Used  . Alcohol use No  . Drug use: No  . Sexual activity: Yes    Partners: Male    Birth control/ protection: None   Other Topics Concern  . None   Social History Narrative  . None    Family History: Family History  Problem Relation Age of Onset  . Cancer Maternal Grandfather     Allergies: No Known Allergies  Prescriptions Prior to Admission  Medication Sig Dispense Refill Last Dose  . Prenat w/o A-FeCbn-Meth-FA-DHA (PRENATE MINI) 29-0.6-0.4-350 MG CAPS Take 1 capsule by mouth daily before breakfast. 90 capsule 3 05/28/2016 at Unknown time     Review of Systems   All systems reviewed and negative except as stated in HPI  Blood pressure (!) 89/54, pulse 82, temperature 98 F (36.7 C), temperature source Oral, resp. rate 16, height 5\' 4"  (1.626 m), weight 84.4 kg (186 lb), last menstrual period 10/08/2015. General appearance: alert and  cooperative Lungs: clear to auscultation bilaterally Heart: regular rate and rhythm Abdomen: soft, non-tender; bowel sounds normal Extremities: Homans sign is negative, no sign of DVT Presentation: cephalic Fetal monitoringBaseline: 150 bpm, Variability: Good {> 6 bpm), Accelerations: Reactive and Decelerations: Absent Uterine activity irregular  Dilation: 4 Effacement (%): 80 Station: -3 Exam by:: Marcelino Duster, RN    Prenatal labs: ABO, Rh: --/--/O POS (02/27 4540) Antibody: NEG (02/27 0508) Rubella:Immune RPR: Non Reactive (12/08 1028)  HBsAg: Negative (10/03 1628)  HIV: Non Reactive (12/08 1028)  GBS: Positive (01/31 0000)  2 hr Glucola 76/103/107 Genetic screening  Neg Anatomy US Normal   Clinic  [facility] Prenatal Labs  Dating  LMP Blood type: O/Positive/-- (10/03 1628)   Genetic Screen  AFP:neg     Quad: 01/22/16 neg   Antibody:Negative (10/03 1628)  Anatomic [place]  normal @19wks ; dating c/w LMP; female fetus Rubella: 3.67 (10/03 1628)  GTT    Third trimester: WNL RPR: Non Reactive (12/08 1028)   Flu vaccine   06/17/2016 HBsAg: Negative (10/03 1628)   TDaP vaccine    06/17/2016                              Rhogam:n/a O+ HIV: Non Reactive (12/08 1028)  Baby Food         Breast                                      ZOX:WRUEAVWUGBS:Positive (01/30 1419)  Contraception          IUD Pap:12/25/15:Normal  Circumcision         n/a   Pediatrician         [facility]   Support Person        MotherMadelynn Done- Yaclia Roman      Prenatal Transfer Tool  Maternal Diabetes: No Genetic Screening: Normal Maternal Ultrasounds/Referrals: Normal Fetal Ultrasounds or other Referrals:  None Maternal Substance Abuse:  No Significant Maternal Medications:  None Significant Maternal Lab Results: Lab values include: Group B Strep positive  Results for orders placed or performed during the hospital encounter of 07/15/16 (from the past 24 hour(s))  CBC   Collection Time: 07/15/16  5:08 AM  Result Value Ref Range    WBC 11.9 (H) 4.0 - 10.5 K/uL   RBC 4.07 3.87 - 5.11 MIL/uL   Hemoglobin 12.8 12.0 - 15.0 g/dL   HCT 98.137.4 19.136.0 - 47.846.0 %   MCV 91.9 78.0 - 100.0 fL   MCH 31.4 26.0 - 34.0 pg   MCHC 34.2 30.0 - 36.0 g/dL   RDW 29.513.5 62.111.5 - 30.815.5 %   Platelets 131 (L) 150 - 400 K/uL  Type and screen [facility]   Collection Time: 07/15/16  5:08 AM  Result Value Ref Range   ABO/RH(D) O POS    Antibody Screen NEG    Sample Expiration 07/18/2016     Patient Active Problem List   Diagnosis Date Noted  . Normal labor 07/15/2016  . Supervision of normal pregnancy, antepartum 03/18/2016  . Personal history of previous postdates pregnancy 02/28/2014  . GBS (group B Streptococcus carrier), +RV culture, currently pregnant 02/26/2014  . Thrombocytopenia complicating pregnancy (HCC) 12/05/2013    Assessment: Brittany Vargas is a 26 y.o. [id number] at 7146w1d here for SOL.   #Labor:Expectant management #Pain: Epidural upon request  #FWB:  Cat I  #ID:  GBS+, PCN prophylaxis  #MOF: Breast and bottle  #MOC:IUD  #Circ:  N/A   De HollingsheadCatherine L Wallace 07/15/2016, 8:50 AM  CNM attestation:  I have seen and examined this patient; I agree with above documentation in the resident's note.   Patient examined with translator in the room [facility](stratus). Patient speaks fluent english and declines future translator services.   Brittany Vargas is a 26 y.o. [id number] at 2646w1d reporting labor onset at 2300 last night +FM, denies LOF, VB, vaginal discharge.  PE: BP 106/61   Pulse 98   Temp 98.1 F (36.7 C) (Oral)   Resp 18   Ht 5\' 4"  (1.626 m)   Wt 186 lb (84.4 kg)   LMP 10/08/2015   SpO2 100%   BMI 31.93 kg/m  Gen: calm comfortable, NAD Resp: normal effort, no distress Abd: gravid  ROS, labs, PMH reviewed FHR category 1   Plan: Admit to labor and delivery  Epidural as needed Anticipate NSVD   Tawnya CrookHogan, Heather Donovan, CNM 10:56 AM

## 2016-07-15 NOTE — Lactation Note (Signed)
This note was copied from a baby's chart. Lactation Consultation Note Experienced BF mom BF her other children now 734 yrs old and 28 yrs old approx. 7 months each. Denies difficulty.  Mom has cone shaped breast w/bulbous areolas and nipples. Hand expressed colostrum.  Mom encouraged to feed baby 8-12 times/24 hours and with feeding cues. Mom encouraged to waken baby for feeds.  Educated newborn feeding habits and behavior, STS, I&O, cluster feeding, supply and demand. WH/LC brochure given w/resources, support groups and LC services. Patient Name: Brittany Vargas ZOXWR'UToday's Date: 07/15/2016 Reason for consult: Initial assessment   Maternal Data Has patient been taught Hand Expression?: Yes Does the patient have breastfeeding experience prior to this delivery?: Yes  Feeding Feeding Type: Breast Fed Length of feed: 0 min  LATCH Score/Interventions       Type of Nipple: Everted at rest and after stimulation  Comfort (Breast/Nipple): Soft / non-tender     Intervention(s): Breastfeeding basics reviewed;Support Pillows;Position options;Skin to skin     Lactation Tools Discussed/Used WIC Program: No   Consult Status Consult Status: Follow-up Date: 07/16/16 Follow-up type: In-patient    Charyl DancerCARVER, [name] 07/15/2016, 10:31 PM

## 2016-07-15 NOTE — Anesthesia Pain Management Evaluation Note (Signed)
  CRNA Pain Management Visit Note  Patient: Brittany Vargas, 26 y.o., female  "Hello [facility]. We have an anesthesia team available at all times to provide care throughout the hospital, including epidural management and anesthesia for C-section. I don't know your plan for the delivery whether it a natural birth, water birth, IV sedation, nitrous supplementation, doula or epidural, but we want to meet your pain goals."   1.Was your pain managed to your expectations on prior hospitalizations?   Yes   2.What is your expectation for pain management during this hospitalization?     IV pain meds  3.How can we help you reach that goal? Pt does not want an epidural, she would like to only use IV pain medicine.  Record the patient's initial score and the patient's pain goal.   Pain: 5  Pain Goal: 10 [facility] wants you to be able to say your pain was always managed very well.  Niobe Dick 07/15/2016

## 2016-07-15 NOTE — Anesthesia Preprocedure Evaluation (Signed)
Anesthesia Evaluation  Patient identified by MRN, date of birth, ID band Patient awake    Reviewed: Allergy & Precautions, Patient's Chart, lab work & pertinent test results  Airway Mallampati: II  TM Distance: >3 FB Neck ROM: Full    Dental no notable dental hx. (+) Teeth Intact   Pulmonary neg pulmonary ROS,    Pulmonary exam normal breath sounds clear to auscultation       Cardiovascular negative cardio ROS Normal cardiovascular exam Rhythm:Regular Rate:Normal     Neuro/Psych negative neurological ROS  negative psych ROS   GI/Hepatic Neg liver ROS, GERD  ,  Endo/Other  Obesity  Renal/GU negative Renal ROS  negative genitourinary   Musculoskeletal negative musculoskeletal ROS (+)   Abdominal (+) + obese,   Peds  Hematology Gestational thrombocytopenia   Anesthesia Other Findings   Reproductive/Obstetrics (+) Pregnancy                             Anesthesia Physical Anesthesia Plan  ASA: II  Anesthesia Plan: Epidural   Post-op Pain Management:    Induction:   Airway Management Planned: Natural Airway  Additional Equipment:   Intra-op Plan:   Post-operative Plan: Extubation in OR  Informed Consent: I have reviewed the patients History and Physical, chart, labs and discussed the procedure including the risks, benefits and alternatives for the proposed anesthesia with the patient or authorized representative who has indicated his/her understanding and acceptance.     Plan Discussed with: Anesthesiologist  Anesthesia Plan Comments:         Anesthesia Quick Evaluation

## 2016-07-15 NOTE — Anesthesia Postprocedure Evaluation (Signed)
Anesthesia Post Note  Patient: Brittany Vargas  Procedure(s) Performed: * No procedures listed *  Patient location during evaluation: Mother Baby Anesthesia Type: Epidural Level of consciousness: awake, awake and alert and oriented Pain management: pain level controlled Vital Signs Assessment: post-procedure vital signs reviewed and stable Respiratory status: spontaneous breathing and nonlabored ventilation Cardiovascular status: stable Postop Assessment: no headache, no backache, epidural receding, patient able to bend at knees, no signs of nausea or vomiting and adequate PO intake Anesthetic complications: no        Last Vitals:  Vitals:   07/15/16 1745 07/15/16 1855  BP: 110/77 110/62  Pulse: (!) 110 100  Resp: 18 18  Temp: 37.8 C 37.9 C    Last Pain:  Vitals:   07/15/16 1855  TempSrc: Oral  PainSc:    Pain Goal: Patients Stated Pain Goal: 5 (07/15/16 1146)               Davide Risdon Hristova

## 2016-07-16 LAB — RPR: RPR Ser Ql: NONREACTIVE

## 2016-07-16 NOTE — Lactation Note (Signed)
This note was copied from a baby's chart. Lactation Consultation Note Follow up visit at 30 hours of age.  Baby has had 10 feedings with 4 voids and 3 stools in the past 24 hours.  Mom reports baby sleepy at breast some and she is trying to keep baby awake.  Baby falling asleep at end of feeding now.  Mom denies pain with latching.  Mom is experienced with older children breastfeeding and denies problems with engorgement. Discussed milk transitioning to larger volume, engorgement care discussed.  Encouraged frequent feedings. Mom to soften breast as needed prior to latch.  LC completed teaching.    Patient Name: Girl Porfirio Oarbigail Greenstreet WUJWJ'XToday's Date: 07/16/2016 Reason for consult: Follow-up assessment   Maternal Data    Feeding Feeding Type: Breast Fed Length of feed: 20 min  LATCH Score/Interventions Latch: Grasps breast easily, tongue down, lips flanged, rhythmical sucking.  Audible Swallowing: A few with stimulation  Type of Nipple: Everted at rest and after stimulation  Comfort (Breast/Nipple): Soft / non-tender     Hold (Positioning): No assistance needed to correctly position infant at breast. Intervention(s): Breastfeeding basics reviewed  LATCH Score: 9  Lactation Tools Discussed/Used     Consult Status Consult Status: Complete Follow-up type: Call as needed    Jimesha Rising, Arvella MerlesJana Lynn 07/16/2016, 9:24 PM

## 2016-07-16 NOTE — Progress Notes (Signed)
Post Partum Day #1 Subjective: no complaints, up ad lib, voiding and tolerating PO  Objective: Blood pressure (!) 93/55, pulse 98, temperature 98 F (36.7 C), temperature source Oral, resp. rate 18, height 5\' 4"  (1.626 m), weight 186 lb (84.4 kg), last menstrual period 10/08/2015, SpO2 99 %, unknown if currently breastfeeding.  Physical Exam:  General: alert, cooperative and no distress Lochia: appropriate Uterine Fundus: firm Incision: none DVT Evaluation: No evidence of DVT seen on physical exam. No cords or calf tenderness. No significant calf/ankle edema.   Recent Labs  07/15/16 0508  HGB 12.8  HCT 37.4    Assessment/Plan: Plan for discharge tomorrow, Breastfeeding and Contraception IUD   LOS: 1 day   Brittany Vargas, CNM 07/16/2016, 7:55 AM

## 2016-07-17 MED ORDER — IBUPROFEN 600 MG PO TABS: 600.0000 mg | ORAL_TABLET | Freq: Four times a day (QID) | ORAL | 2 refills | Status: DC

## 2016-07-17 NOTE — Progress Notes (Signed)
Post Partum Day #2 Subjective: no complaints, up ad lib, voiding and tolerating PO  Objective: Blood pressure 106/62, pulse 77, temperature 97.8 F (36.6 C), temperature source Oral, resp. rate 18, height 5\' 4"  (1.626 m), weight 186 lb (84.4 kg), last menstrual period 10/08/2015, SpO2 99 %, unknown if currently breastfeeding.  Physical Exam:  General: alert, cooperative and no distress Lochia: appropriate Uterine Fundus: firm Incision: none DVT Evaluation: No evidence of DVT seen on physical exam. No cords or calf tenderness. No significant calf/ankle edema.   Recent Labs  07/15/16 0508  HGB 12.8  HCT 37.4    Assessment/Plan: Discharge home, Breastfeeding and Contraception IUD planned.   LOS: 2 days   Brittany Vargas, CNM 07/17/2016, 7:27 AM

## 2016-07-17 NOTE — Lactation Note (Signed)
This note was copied from a baby's chart. Lactation Consultation Note: Mother breastfeeding infant when I arrived in the room. Observed that infant was on shallow. Assist mother with tugging on infants chin and rolling top lip for deeper latch. Infant swaddled in blankets and sleepy. Mother removed infant from breast to rouse and removed blanket. Infant latched back on . Observed infant with strong suckling with intermittent swallows.  Mothers breast are filling. She is able to hand express good flow of colostrum. Mother was given a hand pump with instructions to use as needed. Encouraged to continue to cue base feed and at least 8-12 times in 24 hours. Mother advised to do good breast massage and use ice to decrease swelling. Mother receptive to all teaching.   Patient Name: Brittany Vargas Reason for consult: Follow-up assessment   Maternal Data    Feeding Feeding Type: Breast Fed Length of feed: 20 min (per mom)  LATCH Score/Interventions Latch: Grasps breast easily, tongue down, lips flanged, rhythmical sucking. Intervention(s): Assist with latch;Breast compression  Audible Swallowing: Spontaneous and intermittent Intervention(s): Hand expression Intervention(s): Hand expression  Type of Nipple: Everted at rest and after stimulation  Comfort (Breast/Nipple): Filling, red/small blisters or bruises, mild/mod discomfort  Problem noted: Filling Interventions (Filling): Frequent nursing;Hand pump  Hold (Positioning): Assistance needed to correctly position infant at breast and maintain latch.  LATCH Score: 8  Lactation Tools Discussed/Used     Consult Status Consult Status: Complete    Michel BickersKendrick, Marcelyn Ruppe [name], 11:27 AM

## 2016-07-17 NOTE — Discharge Summary (Signed)
OB Discharge Summary     Patient Name: Brittany Vargas DOB: 11-26-1990 MRN: [id number]  Date of admission: 07/15/2016 Delivering MD: Thressa Sheller D   Date of discharge: 07/17/2016  Admitting diagnosis: 40 WEEKS CTX Intrauterine pregnancy: [redacted]w[redacted]d     Secondary diagnosis:  Active Problems:   Normal labor  Additional problems: none     Discharge diagnosis: Term Pregnancy Delivered                                                                                                Post partum procedures:none  Augmentation: Pitocin  Complications: None  Hospital course:  Onset of Labor With Vaginal Delivery     26 y.o. yo O1H0865 at [redacted]w[redacted]d was admitted in Latent Labor on 07/15/2016. Patient had an uncomplicated labor course as follows:  Membrane Rupture Time/Date: 3:04 PM ,07/15/2016   Intrapartum Procedures: Episiotomy: None [1]                                         Lacerations:  None [1]  Patient had a delivery of a Viable infant. 07/15/2016  Information for the patient's newborn:  Haroldine, Redler [[id number]]  Delivery Method: Vaginal, Spontaneous Delivery (Filed from Delivery Summary)    Pateint had an uncomplicated postpartum course.  She is ambulating, tolerating a regular diet, passing flatus, and urinating well. Patient is discharged home in stable condition on 07/17/16.   Physical exam  Vitals:   07/15/16 2105 07/16/16 0558 07/16/16 1847 07/17/16 0551  BP: (!) 97/57 (!) 93/55 (!) 99/59 106/62  Pulse: 91 98 66 77  Resp: 18 18 18 18   Temp: 98.2 F (36.8 C) 98 F (36.7 C) 97.5 F (36.4 C) 97.8 F (36.6 C)  TempSrc:  Oral Oral Oral  SpO2: 99%     Weight:      Height:       General: alert, cooperative and no distress Lochia: appropriate Uterine Fundus: firm Incision: N/A DVT Evaluation: No evidence of DVT seen on physical exam. No cords or calf tenderness. No significant calf/ankle edema. Labs: Lab Results  Component Value Date   WBC 11.9 (H) 07/15/2016   HGB 12.8 07/15/2016   HCT 37.4 07/15/2016   MCV 91.9 07/15/2016   PLT 131 (L) 07/15/2016   CMP Latest Ref Rng & Units 03/01/2012  Glucose 70 - 99 mg/dL 99  BUN 6 - 23 mg/dL 8  Creatinine 2.84 - 1.32 mg/dL 4.40  Sodium 102 - 725 mEq/L 134(L)  Potassium 3.5 - 5.1 mEq/L 3.6  Chloride 96 - 112 mEq/L 96  CO2 19 - 32 mEq/L 24  Calcium 8.4 - 10.5 mg/dL 8.9  Total Protein 6.0 - 8.3 g/dL 8.2  Total Bilirubin 0.3 - 1.2 mg/dL 0.6  Alkaline Phos 39 - 117 U/L 209(H)  AST 0 - 37 U/L 103(H)  ALT 0 - 35 U/L 87(H)    Discharge instruction: per After Visit Summary and "Baby and Me Booklet".  After visit meds:  Allergies as of  07/17/2016   No Known Allergies     Medication List    TAKE these medications   ibuprofen 600 MG tablet Commonly known as:  ADVIL,MOTRIN Take 1 tablet (600 mg total) by mouth every 6 (six) hours.   PRENATE MINI 29-0.6-0.4-350 MG Caps Take 1 capsule by mouth daily before breakfast.       Diet: routine diet  Activity: Advance as tolerated. Pelvic rest for 6 weeks.   Outpatient follow up:4 weeks Follow up Appt:No future appointments. Follow up Visit:No Follow-up on file.  Postpartum contraception: IUD Mirena  Newborn Data: Live born female  Birth Weight: 9 lb 1.2 oz (4116 g) APGAR: 8, 9  Baby Feeding: Breast Disposition:home with mother   07/17/2016 Roe Coombsachelle A Jocilyn Trego, CNM

## 2016-07-22 ENCOUNTER — Inpatient Hospital Stay (HOSPITAL_COMMUNITY): Admission: RE | Admit: 2016-07-22 | Payer: Medicaid Other | Source: Ambulatory Visit

## 2016-09-02 ENCOUNTER — Ambulatory Visit (INDEPENDENT_AMBULATORY_CARE_PROVIDER_SITE_OTHER): Payer: Medicaid Other | Admitting: Certified Nurse Midwife

## 2016-09-02 ENCOUNTER — Encounter: Payer: Self-pay | Admitting: Certified Nurse Midwife

## 2016-09-02 VITALS — BP 113/73 | HR 73 | Temp 97.0°F | Wt 171.0 lb

## 2016-09-02 DIAGNOSIS — Z30430 Encounter for insertion of intrauterine contraceptive device: Secondary | ICD-10-CM | POA: Diagnosis not present

## 2016-09-02 NOTE — Progress Notes (Signed)
Subjective:     Brittany Vargas is a 26 y.o. female who presents for a postpartum visit. She is 6 weeks postpartum following a spontaneous vaginal delivery. I have fully reviewed the prenatal and intrapartum course. The delivery was at 40 gestational weeks. Outcome: spontaneous vaginal delivery. Anesthesia: none. Postpartum course has been uncomplicated.. Baby's course has been uncomplicated.Pecola Leisure is feeding by breast. Bleeding no bleeding,stopped 08/26/2016 Bowel function is normal. Bladder function is normal. Patient is not sexually active. Contraception method is IUD. Postpartum depression screening: negative.  Tobacco, alcohol and substance abuse history reviewed.  Adult immunizations reviewed including TDAP, rubella and varicella.  The following portions of the patient's history were reviewed and updated as appropriate:  Current Outpatient Prescriptions on File Prior to Visit  Medication Sig Dispense Refill  . ibuprofen (ADVIL,MOTRIN) 600 MG tablet Take 1 tablet (600 mg total) by mouth every 6 (six) hours. 120 tablet 2  . Prenat w/o A-FeCbn-Meth-FA-DHA (PRENATE MINI) 29-0.6-0.4-350 MG CAPS Take 1 capsule by mouth daily before breakfast. 90 capsule 3   No current facility-administered medications on file prior to visit.    .  Review of Systems A comprehensive review of systems was negative.   Objective:    BP 113/73   Pulse 73   Temp 97 F (36.1 C)   Wt 171 lb (77.6 kg)   LMP 08/19/2016 (Exact Date)   Breastfeeding? Yes   BMI 29.35 kg/m   General:  alert, cooperative and appears stated age   Breasts:  inspection negative, no nipple discharge or bleeding, no masses or nodularity palpable  Lungs: clear to auscultation bilaterally  Heart:  regular rate and rhythm, S1, S2 normal, no murmur, click, rub or gallop  Abdomen: soft, non-tender; bowel sounds normal; no masses,  no organomegaly   Vulva:  normal  Vagina: normal vagina  Cervix:  no cervical motion tenderness  Corpus:  normal size, contour, position, consistency, mobility, non-tender  Adnexa:  normal adnexa  Rectal Exam: Not performed.          75% of30 min visit spent on counseling and coordination of care.   Assessment:     6week  postpartum exam. Pap smear not done at today's visit. 12/25/2015-  Plan:    1. Contraception: IUD,placed today 2. Given bleeding precautions and  3. Follow up in: 4 week for string check AND 4 month for annual. 2hr GTT for h/o GDM/screening for DM q 3 yrs per ADA recommendations Preconception counseling provided Healthy lifestyle practices reviewed

## 2016-09-02 NOTE — Progress Notes (Signed)
Here for postpartum exam and IUD insertion.

## 2016-09-02 NOTE — Progress Notes (Signed)
IUD Procedure Note   DIAGNOSIS: Desires long-term, reversible contraception   PROCEDURE: IUD placement Performing Provider: Orvilla Cornwall CNM  Patient counseled prior to procedure. I explained risks and benefits of Mirena IUD, reviewed alternative forms of contraception. Patient stated understanding and consented to continue with procedure.   LMP: unknown, postpartum Pregnancy Test: Negative Lot #: [id number] Expiration Date:  Sep 2020   IUD type:  Mirena   [  ] Paragard  [  ] Candise Che   [  ]  Kyleena  PROCEDURE:  Timeout procedure was performed to ensure right patient and right site.  A bimanual exam was performed to determine the position of the uterus, retroverted. The speculum was placed. The vagina and cervix was sterilized in the usual manner and sterile technique was maintained throughout the course of the procedure. A single toothed tenaculum was not required. The depth of the uterus was sounded to 10 cm. The IUD was inserted to the appropriate depth and inserted without difficulty.  The string was cut to an estimated 4 cm length. Bleeding was minimal. The patient tolerated the procedure well.   Follow up: The patient tolerated the procedure well without complications.  Standard post-procedure care is explained and return precautions are given.  Orvilla Cornwall CNM

## 2016-09-30 ENCOUNTER — Ambulatory Visit: Payer: Medicaid Other | Admitting: Certified Nurse Midwife

## 2017-08-06 ENCOUNTER — Encounter: Payer: Self-pay | Admitting: Certified Nurse Midwife

## 2017-08-06 ENCOUNTER — Other Ambulatory Visit (HOSPITAL_COMMUNITY)
Admission: RE | Admit: 2017-08-06 | Discharge: 2017-08-06 | Disposition: A | Discharge status: Home / Self Care | Payer: Medicaid Other | Source: Ambulatory Visit | Attending: Certified Nurse Midwife | Admitting: Certified Nurse Midwife

## 2017-08-06 ENCOUNTER — Ambulatory Visit (INDEPENDENT_AMBULATORY_CARE_PROVIDER_SITE_OTHER): Payer: Medicaid Other | Admitting: Certified Nurse Midwife

## 2017-08-06 VITALS — BP 100/64 | HR 65 | Wt 177.6 lb

## 2017-08-06 DIAGNOSIS — B9689 Other specified bacterial agents as the cause of diseases classified elsewhere: Secondary | ICD-10-CM

## 2017-08-06 DIAGNOSIS — Z01419 Encounter for gynecological examination (general) (routine) without abnormal findings: Secondary | ICD-10-CM

## 2017-08-06 DIAGNOSIS — Z30431 Encounter for routine checking of intrauterine contraceptive device: Secondary | ICD-10-CM

## 2017-08-06 DIAGNOSIS — Z0000 Encounter for general adult medical examination without abnormal findings: Secondary | ICD-10-CM | POA: Diagnosis not present

## 2017-08-06 DIAGNOSIS — N760 Acute vaginitis: Secondary | ICD-10-CM

## 2017-08-06 DIAGNOSIS — N898 Other specified noninflammatory disorders of vagina: Secondary | ICD-10-CM

## 2017-08-06 MED ORDER — VITAFOL-NANO 18-0.6-0.4 MG PO TABS: 1.0000 | ORAL_TABLET | Freq: Every day | ORAL | 12 refills | Status: DC

## 2017-08-06 MED ORDER — METRONIDAZOLE 500 MG PO TABS: 500.0000 mg | ORAL_TABLET | Freq: Two times a day (BID) | ORAL | 0 refills | Status: DC

## 2017-08-06 NOTE — Progress Notes (Signed)
Pt here for yellowish vaginal discharge with an odor. Sx started about a week ago. Denies vaginal itching/irritation. Pt is also overdue for annual.

## 2017-08-06 NOTE — Progress Notes (Signed)
Subjective:      Brittany Vargas is a 27 y.o. female here for a routine exam.  Current complaints: vaginal discharge.  Appointment changed to annual exam and IUD string check.  Due for pap smear today.  Has not been checking her strings.  Is not breast feeding anymore.  Is currently sexually active.     Personal health questionnaire:  Is patient Ashkenazi Jewish, have a family history of breast and/or ovarian cancer: no Is there a family history of uterine cancer diagnosed at age < 16, gastrointestinal cancer, urinary tract cancer, family member who is a Personnel officer syndrome-associated carrier: no Is the patient overweight and hypertensive, family history of diabetes, personal history of gestational diabetes, preeclampsia or PCOS: no Is patient over 99, have PCOS,  family history of premature CHD under age 35, diabetes, smoke, have hypertension or peripheral artery disease:  no At any time, has a partner hit, kicked or otherwise hurt or frightened you?: no Over the past 2 weeks, have you felt down, depressed or hopeless?: no Over the past 2 weeks, have you felt little interest or pleasure in doing things?:no   Gynecologic History No LMP recorded. (Menstrual status: IUD). Contraception: IUD Last Pap: 12/25/15. Results were: normal Last mammogram: n/a <40 years no significant family hx.   Obstetric History OB History  Gravida Para Term Preterm AB Living  SAB TAB Ectopic Multiple Live Births    2   0 3    # Outcome Date GA Lbr Len/2nd Weight Sex Delivery Anes PTL Lv  5 Term 07/15/16 [redacted]w[redacted]d 15:50 / 00:26 9 lb 1.2 oz (4.116 kg) F Vag-Spont EPI  LIV  4 Term 02/28/14 [redacted]w[redacted]d / 00:32 9 lb 3.6 oz (4.185 kg) M Vag-Spont EPI  LIV     Birth Comments: none  3 Term 01/10/12 [redacted]w[redacted]d 13:01 / 01:06  M Vag-Spont EPI  LIV  2 TAB 2010          1 TAB 2010            Past Medical History:  Diagnosis Date  . Thrombocytopenia (HCC)     Past Surgical History:  Procedure Laterality Date  .  WISDOM TOOTH EXTRACTION       Current Outpatient Medications:  .  ibuprofen (ADVIL,MOTRIN) 600 MG tablet, Take 1 tablet (600 mg total) by mouth every 6 (six) hours., Disp: 120 tablet, Rfl: 2 .  metroNIDAZOLE (FLAGYL) 500 MG tablet, Take 1 tablet (500 mg total) by mouth 2 (two) times daily., Disp: 14 tablet, Rfl: 0 .  Prenatal-Fe Fum-Methf-FA w/o A (VITAFOL-NANO) 18-0.6-0.4 MG TABS, Take 1 tablet by mouth daily., Disp: 30 tablet, Rfl: 12 No Known Allergies  Social History   Tobacco Use  . Smoking status: Never Smoker  . Smokeless tobacco: Never Used  Substance Use Topics  . Alcohol use: No    Alcohol/week: 0.0 oz    Family History  Problem Relation Age of Onset  . Cancer Maternal Grandfather       Review of Systems  Constitutional: negative for fatigue and weight loss Respiratory: negative for cough and wheezing Cardiovascular: negative for chest pain, fatigue and palpitations Gastrointestinal: negative for abdominal pain and change in bowel habits Musculoskeletal:negative for myalgias Neurological: negative for gait problems and tremors Behavioral/Psych: negative for abusive relationship, depression Endocrine: negative for temperature intolerance    Genitourinary:negative for abnormal menstrual periods, genital lesions, hot flashes, sexual problems and vaginal discharge Integument/breast: negative for breast  lump, breast tenderness, nipple discharge and skin lesion(s)    Objective:       BP 100/64   Pulse 65   Wt 177 lb 9.6 oz (80.6 kg)   BMI 30.48 kg/m  General:   alert  Skin:   no rash or abnormalities  Lungs:   clear to auscultation bilaterally  Heart:   regular rate and rhythm, S1, S2 normal, no murmur, click, rub or gallop  Breasts:   normal without suspicious masses, skin or nipple changes or axillary nodes  Abdomen:  normal findings: no organomegaly, soft, non-tender and no hernia  Pelvis:  External genitalia: normal general appearance Urinary system:  urethral meatus normal and bladder without fullness, nontender Vaginal: normal without tenderness, induration or masses Cervix: normal appearance, ectropic, friable with exam, IUD strings present Adnexa: normal bimanual exam Uterus: anteverted and non-tender, normal size   Lab Review Urine pregnancy test Labs reviewed yes Radiologic studies reviewed no  50% of 30 min visit spent on counseling and coordination of care.   Assessment & Plan    Healthy female exam.    1. Vaginal discharge    - Cervicovaginal ancillary only  2. Well woman exam    - Cytology - PAP - Prenatal-Fe Fum-Methf-FA w/o A (VITAFOL-NANO) 18-0.6-0.4 MG TABS; Take 1 tablet by mouth daily.  Dispense: 30 tablet; Refill: 12  3. BV (bacterial vaginosis)     - Cervicovaginal ancillary only - metroNIDAZOLE (FLAGYL) 500 MG tablet; Take 1 tablet (500 mg total) by mouth 2 (two) times daily.  Dispense: 14 tablet; Refill: 0  4. IUD check up       Education reviewed: calcium supplements, depression evaluation, low fat, low cholesterol diet, safe sex/STD prevention, self breast exams, skin cancer screening and weight bearing exercise. Contraception: IUD. Follow up in: 1 year.   Meds ordered this encounter  Medications  . metroNIDAZOLE (FLAGYL) 500 MG tablet    Sig: Take 1 tablet (500 mg total) by mouth 2 (two) times daily.    Dispense:  14 tablet    Refill:  0  . Prenatal-Fe Fum-Methf-FA w/o A (VITAFOL-NANO) 18-0.6-0.4 MG TABS    Sig: Take 1 tablet by mouth daily.    Dispense:  30 tablet    Refill:  12

## 2017-08-07 LAB — CERVICOVAGINAL ANCILLARY ONLY
Bacterial vaginitis: POSITIVE — AB
CANDIDA VAGINITIS: NEGATIVE
Chlamydia: NEGATIVE
Neisseria Gonorrhea: NEGATIVE
Trichomonas: NEGATIVE

## 2017-08-10 LAB — CYTOLOGY - PAP: Diagnosis: NEGATIVE

## 2017-08-18 ENCOUNTER — Other Ambulatory Visit: Payer: Self-pay | Admitting: Certified Nurse Midwife

## 2017-12-24 ENCOUNTER — Emergency Department (HOSPITAL_COMMUNITY)
Admission: EM | Admit: 2017-12-24 | Discharge: 2017-12-25 | Disposition: A | Discharge status: Home / Self Care | Payer: Medicaid Other | Attending: Emergency Medicine | Admitting: Emergency Medicine

## 2017-12-24 ENCOUNTER — Emergency Department (HOSPITAL_COMMUNITY): Payer: Medicaid Other

## 2017-12-24 ENCOUNTER — Encounter (HOSPITAL_COMMUNITY): Payer: Self-pay | Admitting: Emergency Medicine

## 2017-12-24 ENCOUNTER — Other Ambulatory Visit: Payer: Self-pay

## 2017-12-24 DIAGNOSIS — R509 Fever, unspecified: Secondary | ICD-10-CM | POA: Diagnosis not present

## 2017-12-24 DIAGNOSIS — N12 Tubulo-interstitial nephritis, not specified as acute or chronic: Secondary | ICD-10-CM

## 2017-12-24 DIAGNOSIS — N10 Acute tubulo-interstitial nephritis: Secondary | ICD-10-CM | POA: Diagnosis not present

## 2017-12-24 LAB — CBC WITH DIFFERENTIAL/PLATELET
ABS IMMATURE GRANULOCYTES: 0 10*3/uL (ref 0.0–0.1)
Basophils Absolute: 0 10*3/uL (ref 0.0–0.1)
Basophils Relative: 1 %
EOS ABS: 0 10*3/uL (ref 0.0–0.7)
Eosinophils Relative: 0 %
HEMATOCRIT: 45.6 % (ref 36.0–46.0)
HEMOGLOBIN: 15.5 g/dL — AB (ref 12.0–15.0)
IMMATURE GRANULOCYTES: 1 %
LYMPHS PCT: 12 %
Lymphs Abs: 0.7 10*3/uL (ref 0.7–4.0)
MCH: 32.8 pg (ref 26.0–34.0)
MCHC: 34 g/dL (ref 30.0–36.0)
MCV: 96.4 fL (ref 78.0–100.0)
MONOS PCT: 9 %
Monocytes Absolute: 0.5 10*3/uL (ref 0.1–1.0)
NEUTROS ABS: 4.9 10*3/uL (ref 1.7–7.7)
NEUTROS PCT: 79 %
Platelets: 135 10*3/uL — ABNORMAL LOW (ref 150–400)
RBC: 4.73 MIL/uL (ref 3.87–5.11)
RDW: 13.2 % (ref 11.5–15.5)
WBC: 6.3 10*3/uL (ref 4.0–10.5)

## 2017-12-24 LAB — URINALYSIS, ROUTINE W REFLEX MICROSCOPIC
BACTERIA UA: NONE SEEN
BILIRUBIN URINE: NEGATIVE
GLUCOSE, UA: NEGATIVE mg/dL
Glucose, UA: NEGATIVE mg/dL
HGB URINE DIPSTICK: NEGATIVE
HGB URINE DIPSTICK: NEGATIVE
Ketones, ur: 5 mg/dL — AB
Ketones, ur: 5 mg/dL — AB
Leukocytes, UA: NEGATIVE
NITRITE: NEGATIVE
Nitrite: NEGATIVE
PROTEIN: 100 mg/dL — AB
PROTEIN: NEGATIVE mg/dL
Specific Gravity, Urine: 1.039 — ABNORMAL HIGH (ref 1.005–1.030)
Specific Gravity, Urine: 1.009 (ref 1.005–1.030)
pH: 5 (ref 5.0–8.0)
pH: 5 (ref 5.0–8.0)

## 2017-12-24 LAB — WET PREP, GENITAL
CLUE CELLS WET PREP: NONE SEEN
SPERM: NONE SEEN
TRICH WET PREP: NONE SEEN
Yeast Wet Prep HPF POC: NONE SEEN

## 2017-12-24 LAB — COMPREHENSIVE METABOLIC PANEL
ALT: 15 U/L (ref 0–44)
ANION GAP: 8 (ref 5–15)
AST: 21 U/L (ref 15–41)
Albumin: 4.1 g/dL (ref 3.5–5.0)
Alkaline Phosphatase: 60 U/L (ref 38–126)
BUN: 9 mg/dL (ref 6–20)
CO2: 25 mmol/L (ref 22–32)
Calcium: 8.8 mg/dL — ABNORMAL LOW (ref 8.9–10.3)
Chloride: 106 mmol/L (ref 98–111)
Creatinine, Ser: 0.91 mg/dL (ref 0.44–1.00)
Glucose, Bld: 105 mg/dL — ABNORMAL HIGH (ref 70–99)
POTASSIUM: 4.2 mmol/L (ref 3.5–5.1)
SODIUM: 139 mmol/L (ref 135–145)
Total Bilirubin: 1 mg/dL (ref 0.3–1.2)
Total Protein: 7.9 g/dL (ref 6.5–8.1)

## 2017-12-24 LAB — I-STAT BETA HCG BLOOD, ED (MC, WL, AP ONLY): I-stat hCG, quantitative: <5 m[IU]/mL (ref <5)

## 2017-12-24 LAB — I-STAT CG4 LACTIC ACID, ED: LACTIC ACID, VENOUS: 1.19 mmol/L (ref 0.5–1.9)

## 2017-12-24 MED ORDER — SODIUM CHLORIDE 0.9 % IV BOLUS: 1000.0000 mL | Freq: Once | INTRAVENOUS | Status: DC

## 2017-12-24 MED ORDER — ACETAMINOPHEN 325 MG PO TABS
650.0000 mg | ORAL_TABLET | Freq: Once | ORAL | Status: AC
  Administered 2017-12-24: 650 mg via ORAL
  Filled 2017-12-24: qty 2

## 2017-12-24 MED ORDER — SODIUM CHLORIDE 0.9 % IV SOLN: 1.0000 g | Freq: Once | INTRAVENOUS | Status: DC

## 2017-12-24 MED ORDER — CEPHALEXIN 250 MG PO CAPS
500.0000 mg | ORAL_CAPSULE | Freq: Once | ORAL | Status: AC
  Administered 2017-12-24: 500 mg via ORAL
  Filled 2017-12-24: qty 2

## 2017-12-24 MED ORDER — IBUPROFEN 400 MG PO TABS
600.0000 mg | ORAL_TABLET | Freq: Once | ORAL | Status: AC
  Administered 2017-12-24: 600 mg via ORAL
  Filled 2017-12-24: qty 1

## 2017-12-24 MED ORDER — IBUPROFEN 400 MG PO TABS
400.0000 mg | ORAL_TABLET | Freq: Once | ORAL | Status: AC
  Administered 2017-12-24: 400 mg via ORAL
  Filled 2017-12-24: qty 1

## 2017-12-24 MED ORDER — IOHEXOL 300 MG/ML  SOLN
100.0000 mL | Freq: Once | INTRAMUSCULAR | Status: AC | PRN
  Administered 2017-12-24: 100 mL via INTRAVENOUS

## 2017-12-24 MED ORDER — ONDANSETRON 4 MG PO TBDP
4.0000 mg | ORAL_TABLET | Freq: Once | ORAL | Status: AC
  Administered 2017-12-24: 4 mg via ORAL
  Filled 2017-12-24: qty 1

## 2017-12-24 MED ORDER — CEPHALEXIN 500 MG PO CAPS: 500.0000 mg | ORAL_CAPSULE | Freq: Three times a day (TID) | ORAL | 0 refills | Status: DC

## 2017-12-24 MED ORDER — SODIUM CHLORIDE 0.9 % IV BOLUS
1000.0000 mL | Freq: Once | INTRAVENOUS | Status: AC
  Administered 2017-12-24: 1000 mL via INTRAVENOUS

## 2017-12-24 NOTE — ED Provider Notes (Signed)
[facility] EMERGENCY DEPARTMENT Provider Note   CSN: [id number] Arrival date & time: 12/24/17  1239     History   Chief Complaint Chief Complaint  Patient presents with  . Fever  . Emesis    HPI Brittany Vargas is a 27 y.o. female who presents with fever, chills, back pain.  Medical history significant for pyelonephritis in 2013.  She states that she has been in [place] for the past month and came back on the plane last night.  She states she started to feel symptoms while she was on the plane.  She reports having a fever, chills, body aches especially in the flank and into her legs.  She has had nausea and one episode of vomiting today.  She reports urinary frequency but no dysuria.  She denies URI symptoms, cough, abdominal or pelvic pain, diarrhea, or rash.  HPI  Past Medical History:  Diagnosis Date  . Thrombocytopenia [facility])     Patient Active Problem List   Diagnosis Date Noted  . Thrombocytopenia complicating pregnancy (HCC) 12/05/2013    Past Surgical History:  Procedure Laterality Date  . WISDOM TOOTH EXTRACTION       OB History    Gravida  5   Para  3   Term  3   Preterm      AB  2   Living  3     SAB      TAB  2   Ectopic      Multiple  0   Live Births  3            Home Medications    Prior to Admission medications   Medication Sig Start Date End Date Taking? Authorizing Provider  ibuprofen (ADVIL,MOTRIN) 600 MG tablet Take 1 tablet (600 mg total) by mouth every 6 (six) hours. 07/17/16   Orvilla Cornwall A, CNM  metroNIDAZOLE (FLAGYL) 500 MG tablet Take 1 tablet (500 mg total) by mouth 2 (two) times daily. 08/06/17   Denney, Rachelle A, CNM  Prenatal-Fe Fum-Methf-FA w/o A (VITAFOL-NANO) 18-0.6-0.4 MG TABS Take 1 tablet by mouth daily. 08/06/17   Roe Coombs, CNM    Family History Family History  Problem Relation Age of Onset  . Cancer Maternal Grandfather     Social History Social History   Tobacco Use   . Smoking status: Never Smoker  . Smokeless tobacco: Never Used  Substance Use Topics  . Alcohol use: No    Alcohol/week: 0.0 standard drinks  . Drug use: No     Allergies   Patient has no known allergies.   Review of Systems Review of Systems  Constitutional: Positive for chills and fever.  Respiratory: Negative for shortness of breath.   Cardiovascular: Negative for chest pain.  Gastrointestinal: Positive for nausea and vomiting. Negative for abdominal pain and diarrhea.  Genitourinary: Positive for flank pain, frequency and vaginal discharge. Negative for difficulty urinating, dysuria, pelvic pain and vaginal bleeding.  Musculoskeletal: Positive for back pain and myalgias.  All other systems reviewed and are negative.    Physical Exam Updated Vital Signs BP 111/74   Pulse 86   Temp (!) 103.1 F (39.5 C) (Oral)   Resp 16   SpO2 96%   Physical Exam  Constitutional: She is oriented to person, place, and time. She appears well-developed and well-nourished. No distress.  HENT:  Head: Normocephalic and atraumatic.  Eyes: Pupils are equal, round, and reactive to light. Conjunctivae are normal. Right eye  exhibits no discharge. Left eye exhibits no discharge. No scleral icterus.  Neck: Normal range of motion.  Cardiovascular: Regular rhythm. Tachycardia present.  Pulmonary/Chest: Effort normal and breath sounds normal. No respiratory distress.  Abdominal: Soft. Bowel sounds are normal. She exhibits no distension. There is tenderness (Bilateral CVA tenderness).  Genitourinary:  Genitourinary Comments: Pelvic: No inguinal lymphadenopathy or inguinal hernia noted. Normal external genitalia. No pain with speculum insertion. Closed cervical os with normal appearance - no rash or lesions. No significant discharge or bleeding noted from cervix or in vaginal vault. IUD strings in place. On bimanual examination no adnexal tenderness or cervical motion tenderness. Chaperone Hydrographic surveyor,  Charity fundraiser) present during exam.    Neurological: She is alert and oriented to person, place, and time.  Skin: Skin is warm and dry.  Psychiatric: She has a normal mood and affect. Her behavior is normal.  Nursing note and vitals reviewed.    ED Treatments / Results  Labs (all labs ordered are listed, but only abnormal results are displayed) Labs Reviewed  WET PREP, GENITAL - Abnormal; Notable for the following components:      Result Value   WBC, Wet Prep HPF POC MANY (*)    All other components within normal limits  COMPREHENSIVE METABOLIC PANEL - Abnormal; Notable for the following components:   Glucose, Bld 105 (*)    Calcium 8.8 (*)    All other components within normal limits  CBC WITH DIFFERENTIAL/PLATELET - Abnormal; Notable for the following components:   Hemoglobin 15.5 (*)    Platelets 135 (*)    All other components within normal limits  URINALYSIS, ROUTINE W REFLEX MICROSCOPIC - Abnormal; Notable for the following components:   Color, Urine AMBER (*)    APPearance CLOUDY (*)    Specific Gravity, Urine 1.039 (*)    Bilirubin Urine SMALL (*)    Ketones, ur 5 (*)    Protein, ur 100 (*)    Leukocytes, UA SMALL (*)    Non Squamous Epithelial 0-5 (*)    All other components within normal limits  URINALYSIS, ROUTINE W REFLEX MICROSCOPIC - Abnormal; Notable for the following components:   Ketones, ur 5 (*)    All other components within normal limits  URINE CULTURE  HIV ANTIBODY (ROUTINE TESTING)  RPR  I-STAT CG4 LACTIC ACID, ED  I-STAT BETA HCG BLOOD, ED ([facility], [facility], [facility] ONLY)  GC/CHLAMYDIA PROBE AMP ([place]) NOT AT [facility]    EKG None  Radiology Dg Chest 2 View  Result Date: 12/24/2017 CLINICAL DATA:  Back pain and fevers, recent international travel EXAM: CHEST - 2 VIEW COMPARISON:  08/18/2005 FINDINGS: The heart size and mediastinal contours are within normal limits. Both lungs are clear. The visualized skeletal structures are unremarkable. IMPRESSION: No active  cardiopulmonary disease. Electronically Signed   By: Alcide Clever M.D.   On: 12/24/2017 13:36   Ct Abdomen Pelvis W Contrast  Result Date: 12/24/2017 CLINICAL DATA:  Acute onset of generalized body pain and nausea. Fever. EXAM: CT ABDOMEN AND PELVIS WITH CONTRAST TECHNIQUE: Multidetector CT imaging of the abdomen and pelvis was performed using the standard protocol following bolus administration of intravenous contrast. CONTRAST:  OMNIPAQUE IOHEXOL 300 MG/ML  SOLN COMPARISON:  None. FINDINGS: Lower chest: The visualized lung bases are grossly clear. The visualized portions of the mediastinum are unremarkable. Hepatobiliary: The liver is unremarkable in appearance. A small stone is noted within the gallbladder. The common bile duct remains normal in caliber. Pancreas: The pancreas is  within normal limits. Spleen: The spleen is unremarkable in appearance. Adrenals/Urinary Tract: The adrenal glands are unremarkable in appearance. The kidneys are within normal limits. There is no evidence of hydronephrosis. No renal or ureteral stones are identified. No perinephric stranding is seen. Stomach/Bowel: The stomach is unremarkable in appearance. The small bowel is within normal limits. The appendix is normal in caliber, without evidence of appendicitis. The colon is unremarkable in appearance. Vascular/Lymphatic: The abdominal aorta is unremarkable in appearance. The inferior vena cava is grossly unremarkable. No retroperitoneal lymphadenopathy is seen. No pelvic sidewall lymphadenopathy is identified. Reproductive: The bladder is mildly distended and grossly unremarkable. The uterus is grossly unremarkable. An intrauterine device is noted in expected position at the fundus of the uterus. A left-sided follicle is noted. No suspicious adnexal masses are seen. Other: No additional soft tissue abnormalities are seen. Musculoskeletal: No acute osseous abnormalities are identified. The visualized musculature is  unremarkable in appearance. IMPRESSION: 1. No acute abnormality seen within the abdomen or pelvis. 2. Cholelithiasis; gallbladder otherwise unremarkable in appearance. Electronically Signed   By: Roanna RaiderJeffery  Chang M.D.   On: 12/24/2017 22:59    Procedures Procedures (including critical care time)  Medications Ordered in ED Medications  cephALEXin (KEFLEX) capsule 500 mg (has no administration in time range)  acetaminophen (TYLENOL) tablet 650 mg (650 mg Oral Given 12/24/17 1307)  ibuprofen (ADVIL,MOTRIN) tablet 400 mg (400 mg Oral Given 12/24/17 1307)  ondansetron (ZOFRAN-ODT) disintegrating tablet 4 mg (4 mg Oral Given 12/24/17 1308)  sodium chloride 0.9 % bolus 1,000 mL (0 mLs Intravenous Stopped 12/24/17 1948)  ibuprofen (ADVIL,MOTRIN) tablet 600 mg (600 mg Oral Given 12/24/17 2221)  iohexol (OMNIPAQUE) 300 MG/ML solution 100 mL (100 mLs Intravenous Contrast Given 12/24/17 2201)     Initial Impression / Assessment and Plan / ED Course  I have reviewed the triage vital signs and the nursing notes.  Pertinent labs & imaging results that were available during my care of the patient were reviewed by me and considered in my medical decision making (see chart for details).  27 year old female presents with fever, chills, flank pain and urinary frequency.  Symptoms are most consistent with kidney infection.  She is febrile to 103 and tachycardic in triage.  Other vital signs are normal.  On exam she has bilateral CVA tenderness.  No abdominal tenderness.  Blood work is overall normal.  Her CBC is normal.  Her CMP is normal.  Lactic acid is normal.  Initial urine sample appeared abnormal but contaminated.  Will complete pelvic exam.  8:27 PM Pelvic performed. She does have an irritated appearing cervix with IUD strings visible but no significant discharge, CMT, or adnexal tenderness. Will recollect urine since initial specimen was contaminated.  Repeat urine is normal.  She is febrile again to 103.  Will  obtain CT of the abdomen and pelvis to rule out any acute intra-abdominal abnormality.  CT is normal.  We will treat her clinically for pyelonephritis.  She refuses IV Rocephin.  We will give her Keflex and prescription for home.  She was given return precautions.     Final Clinical Impressions(s) / ED Diagnoses   Final diagnoses:  Pyelonephritis    ED Discharge Orders    None       Bethel BornGekas, Janiah Devinney Marie, PA-C 12/24/17 2350    Vanetta MuldersZackowski, Scott, MD 12/26/17 [phone]

## 2017-12-24 NOTE — ED Notes (Signed)
Pt did not answer when  Called for vitals check in waiting room.

## 2017-12-24 NOTE — ED Notes (Signed)
Patient transported to CT 

## 2017-12-24 NOTE — ED Notes (Signed)
Pt provides UA. Not enough to add on culture.

## 2017-12-24 NOTE — ED Notes (Signed)
ED PA aware of pt temp

## 2017-12-24 NOTE — Discharge Instructions (Signed)
Take Keflex 500mg  three times a day for the next 10 days Take Tylenol and Ibuprofen for fever/pain Please return if you are worsening

## 2017-12-24 NOTE — ED Triage Notes (Signed)
Pt presents to ED for assessment of 1.5 days of fever, chills, back pain with generaliized body aches, sore legs.  Denies cough or congestion,  Denies changes in urination or abdominal pain.  Pt sts hx of kidney infection with similar symptoms.  Temp 103 in triage.

## 2017-12-25 LAB — GC/CHLAMYDIA PROBE AMP (~~LOC~~) NOT AT ARMC
CHLAMYDIA, DNA PROBE: NEGATIVE
NEISSERIA GONORRHEA: NEGATIVE

## 2017-12-25 LAB — RPR: RPR: NONREACTIVE

## 2017-12-25 LAB — HIV ANTIBODY (ROUTINE TESTING W REFLEX): HIV Screen 4th Generation wRfx: NONREACTIVE

## 2017-12-26 LAB — URINE CULTURE: Culture: NO GROWTH

## 2018-11-23 ENCOUNTER — Encounter: Payer: Self-pay | Admitting: Obstetrics and Gynecology

## 2018-11-23 ENCOUNTER — Ambulatory Visit: Payer: Medicaid Other | Admitting: Obstetrics & Gynecology

## 2018-11-23 ENCOUNTER — Other Ambulatory Visit: Payer: Self-pay

## 2018-11-23 ENCOUNTER — Other Ambulatory Visit (HOSPITAL_COMMUNITY)
Admission: RE | Admit: 2018-11-23 | Discharge: 2018-11-23 | Disposition: A | Discharge status: Home / Self Care | Payer: Medicaid Other | Source: Ambulatory Visit | Attending: Obstetrics & Gynecology | Admitting: Obstetrics & Gynecology

## 2018-11-23 ENCOUNTER — Ambulatory Visit: Payer: Medicaid Other | Admitting: Obstetrics and Gynecology

## 2018-11-23 VITALS — BP 106/68 | HR 76 | Wt 170.7 lb

## 2018-11-23 DIAGNOSIS — Z3040 Encounter for surveillance of contraceptives, unspecified: Secondary | ICD-10-CM

## 2018-11-23 DIAGNOSIS — Z113 Encounter for screening for infections with a predominantly sexual mode of transmission: Secondary | ICD-10-CM

## 2018-11-23 NOTE — Progress Notes (Signed)
Patient is in the office for std testing and IUD check, placed 09-02-16. Pt states she used to feel IUD strings and she no longer does. Last pap 08-06-17.

## 2018-11-23 NOTE — Progress Notes (Signed)
  GYNECOLOGY PROGRESS NOTE  History:  28 y.o. [id number] presents to [facility] office today for problem gyn visit. She reports she was not able to feel her IUD strings while taking a shower about 2 wks ago. She also is requesting STI screening.  She denies h/a, dizziness, shortness of breath, n/v, or fever/chills.    The following portions of the patient's history were reviewed and updated as appropriate: allergies, current medications, past family history, past medical history, past social history, past surgical history and problem list. Last pap smear on 07/2017 was normal.  Review of Systems:  Pertinent items are noted in HPI.   Objective:  Physical Exam Blood pressure 106/68, pulse 76, weight 170 lb 11.2 oz (77.4 kg), currently breastfeeding. VS reviewed, nursing note reviewed,  Constitutional: well developed, well nourished, no distress HEENT: normocephalic CV: normal rate Pulm/chest wall: normal effort Breast Exam: deferred Abdomen: soft Neuro: alert and oriented x 3 Skin: warm, dry Psych: affect normal Pelvic exam: Cervix pink, visually closed, without lesion, scant white creamy discharge, vaginal walls and external genitalia normal Bimanual exam: Cervix 0/long/high, firm, anterior, neg CMT, uterus nontender, nonenlarged, adnexa without tenderness, enlargement, or mass  Assessment & Plan:  Encounter for surveillance of contraceptive device  - IUD seen on U/S   - Reassurance given that IUD strings were seen with Speculum exam also - Advised that strings have been placed at the 1 o'clock position  Routine screening for STI (sexually transmitted infection)  - Cervicovaginal swab pending results - Will await results for any necessary tx  - F/U with AEX in 07/2019   Laury Deep, CNM 10:51 AM

## 2018-11-24 LAB — CERVICOVAGINAL ANCILLARY ONLY
Bacterial vaginitis: POSITIVE — AB
Candida vaginitis: NEGATIVE
Chlamydia: NEGATIVE
Neisseria Gonorrhea: NEGATIVE
Trichomonas: NEGATIVE

## 2018-11-25 ENCOUNTER — Other Ambulatory Visit: Payer: Self-pay

## 2018-11-25 DIAGNOSIS — N760 Acute vaginitis: Secondary | ICD-10-CM

## 2018-11-25 DIAGNOSIS — B9689 Other specified bacterial agents as the cause of diseases classified elsewhere: Secondary | ICD-10-CM

## 2018-11-25 MED ORDER — METRONIDAZOLE 500 MG PO TABS: 500.0000 mg | ORAL_TABLET | Freq: Two times a day (BID) | ORAL | 0 refills | Status: DC

## 2018-11-25 NOTE — Progress Notes (Signed)
Rx sent as directed  Pt made aware and voiced understanding.

## 2018-12-08 ENCOUNTER — Other Ambulatory Visit: Payer: Self-pay

## 2018-12-08 DIAGNOSIS — Z20822 Contact with and (suspected) exposure to covid-19: Secondary | ICD-10-CM

## 2018-12-12 LAB — NOVEL CORONAVIRUS, NAA: SARS-CoV-2, NAA: NOT DETECTED

## 2020-07-15 ENCOUNTER — Emergency Department (HOSPITAL_COMMUNITY): Payer: Medicaid Other

## 2020-07-15 ENCOUNTER — Encounter (HOSPITAL_COMMUNITY): Payer: Self-pay | Admitting: Emergency Medicine

## 2020-07-15 ENCOUNTER — Emergency Department (HOSPITAL_COMMUNITY)
Admission: EM | Admit: 2020-07-15 | Discharge: 2020-07-15 | Disposition: A | Discharge status: Home / Self Care | Payer: Medicaid Other | Attending: Emergency Medicine | Admitting: Emergency Medicine

## 2020-07-15 DIAGNOSIS — S61210A Laceration without foreign body of right index finger without damage to nail, initial encounter: Secondary | ICD-10-CM | POA: Diagnosis not present

## 2020-07-15 DIAGNOSIS — S61307A Unspecified open wound of left little finger with damage to nail, initial encounter: Secondary | ICD-10-CM | POA: Insufficient documentation

## 2020-07-15 DIAGNOSIS — Y92009 Unspecified place in unspecified non-institutional (private) residence as the place of occurrence of the external cause: Secondary | ICD-10-CM | POA: Diagnosis not present

## 2020-07-15 DIAGNOSIS — S6991XA Unspecified injury of right wrist, hand and finger(s), initial encounter: Secondary | ICD-10-CM | POA: Diagnosis present

## 2020-07-15 DIAGNOSIS — Z23 Encounter for immunization: Secondary | ICD-10-CM | POA: Diagnosis not present

## 2020-07-15 DIAGNOSIS — S61305A Unspecified open wound of left ring finger with damage to nail, initial encounter: Secondary | ICD-10-CM | POA: Diagnosis not present

## 2020-07-15 DIAGNOSIS — S61411A Laceration without foreign body of right hand, initial encounter: Secondary | ICD-10-CM

## 2020-07-15 DIAGNOSIS — S61309A Unspecified open wound of unspecified finger with damage to nail, initial encounter: Secondary | ICD-10-CM

## 2020-07-15 MED ORDER — TETANUS-DIPHTH-ACELL PERTUSSIS 5-2.5-18.5 LF-MCG/0.5 IM SUSY
0.5000 mL | PREFILLED_SYRINGE | Freq: Once | INTRAMUSCULAR | Status: AC
  Administered 2020-07-15: 0.5 mL via INTRAMUSCULAR
  Filled 2020-07-15: qty 0.5

## 2020-07-15 NOTE — ED Notes (Signed)
Reviewed discharge instructions with patient. Follow-up care reviewed. Patient verbalized understanding. Patient A&Ox4, VSS, and ambulatory with steady gait upon discharge.  
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
## 2020-07-15 NOTE — Discharge Instructions (Signed)
In the areas where your nails were pulled off please keep these covered, clean and dry, change dressing 1-2 times daily and monitor closely for any signs of infection such as redness, swelling, increasing pain or drainage.  Keep covered with antibiotic ointment and bandage.  The laceration to your right hand is very superficial and should heal quickly on its own, keep clean, dry and covered with antibiotic ointment as well.  Your x-ray did not show any evidence of fractures.  I would recommend having the rest of your acrylic nails removed, in particular when these are long if they are hit or injured it increases the risk of pulling away her natural nail as well.  Your tetanus vaccine was updated today and is good for the next 10 years.
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
## 2020-11-09 ENCOUNTER — Ambulatory Visit: Payer: Medicaid Other | Admitting: Obstetrics and Gynecology

## 2021-03-30 ENCOUNTER — Encounter (HOSPITAL_COMMUNITY): Payer: Self-pay | Admitting: Emergency Medicine

## 2021-03-30 ENCOUNTER — Emergency Department (HOSPITAL_COMMUNITY)
Admission: EM | Admit: 2021-03-30 | Discharge: 2021-03-31 | Disposition: A | Discharge status: Home / Self Care | Payer: Medicaid Other | Attending: Emergency Medicine | Admitting: Emergency Medicine

## 2021-03-30 ENCOUNTER — Other Ambulatory Visit: Payer: Self-pay

## 2021-03-30 ENCOUNTER — Emergency Department (HOSPITAL_COMMUNITY): Payer: Medicaid Other

## 2021-03-30 DIAGNOSIS — R509 Fever, unspecified: Secondary | ICD-10-CM | POA: Diagnosis not present

## 2021-03-30 DIAGNOSIS — R0789 Other chest pain: Secondary | ICD-10-CM | POA: Insufficient documentation

## 2021-03-30 DIAGNOSIS — R6889 Other general symptoms and signs: Secondary | ICD-10-CM

## 2021-03-30 DIAGNOSIS — Z20822 Contact with and (suspected) exposure to covid-19: Secondary | ICD-10-CM | POA: Insufficient documentation

## 2021-03-30 DIAGNOSIS — R110 Nausea: Secondary | ICD-10-CM | POA: Diagnosis not present

## 2021-03-30 DIAGNOSIS — Z28310 Unvaccinated for covid-19: Secondary | ICD-10-CM | POA: Insufficient documentation

## 2021-03-30 DIAGNOSIS — R059 Cough, unspecified: Secondary | ICD-10-CM | POA: Diagnosis not present

## 2021-03-30 DIAGNOSIS — R000 Tachycardia, unspecified: Secondary | ICD-10-CM | POA: Diagnosis not present

## 2021-03-30 LAB — BASIC METABOLIC PANEL
Anion gap: 6 (ref 5–15)
BUN: 7 mg/dL (ref 6–20)
CO2: 23 mmol/L (ref 22–32)
Calcium: 8.7 mg/dL — ABNORMAL LOW (ref 8.9–10.3)
Chloride: 104 mmol/L (ref 98–111)
Creatinine, Ser: 0.88 mg/dL (ref 0.44–1.00)
GFR, Estimated: >60 mL/min (ref >60)
Glucose, Bld: 128 mg/dL — ABNORMAL HIGH (ref 70–99)
Potassium: 3.8 mmol/L (ref 3.5–5.1)
Sodium: 133 mmol/L — ABNORMAL LOW (ref 135–145)

## 2021-03-30 LAB — URINALYSIS, ROUTINE W REFLEX MICROSCOPIC
Bacteria, UA: NONE SEEN
Bilirubin Urine: NEGATIVE
Glucose, UA: NEGATIVE mg/dL
Ketones, ur: NEGATIVE mg/dL
Leukocytes,Ua: NEGATIVE
Nitrite: NEGATIVE
Protein, ur: NEGATIVE mg/dL
Specific Gravity, Urine: 1.012 (ref 1.005–1.030)
pH: 5 (ref 5.0–8.0)

## 2021-03-30 LAB — CBC WITH DIFFERENTIAL/PLATELET
Abs Immature Granulocytes: 0.06 10*3/uL (ref 0.00–0.07)
Basophils Absolute: 0 10*3/uL (ref 0.0–0.1)
Basophils Relative: 0 %
Eosinophils Absolute: 0 10*3/uL (ref 0.0–0.5)
Eosinophils Relative: 0 %
HCT: 41 % (ref 36.0–46.0)
Hemoglobin: 14.2 g/dL (ref 12.0–15.0)
Immature Granulocytes: 1 %
Lymphocytes Relative: 9 %
Lymphs Abs: 1.1 10*3/uL (ref 0.7–4.0)
MCH: 32.2 pg (ref 26.0–34.0)
MCHC: 34.6 g/dL (ref 30.0–36.0)
MCV: 93 fL (ref 80.0–100.0)
Monocytes Absolute: 1.2 10*3/uL — ABNORMAL HIGH (ref 0.1–1.0)
Monocytes Relative: 10 %
Neutro Abs: 9.5 10*3/uL — ABNORMAL HIGH (ref 1.7–7.7)
Neutrophils Relative %: 80 %
Platelets: 165 10*3/uL (ref 150–400)
RBC: 4.41 MIL/uL (ref 3.87–5.11)
RDW: 13 % (ref 11.5–15.5)
WBC: 11.9 10*3/uL — ABNORMAL HIGH (ref 4.0–10.5)
nRBC: 0 % (ref 0.0–0.2)

## 2021-03-30 LAB — I-STAT BETA HCG BLOOD, ED (MC, WL, AP ONLY): I-stat hCG, quantitative: <5 m[IU]/mL (ref <5)

## 2021-03-30 LAB — RESP PANEL BY RT-PCR (FLU A&B, COVID) ARPGX2
Influenza A by PCR: NEGATIVE
Influenza B by PCR: NEGATIVE
SARS Coronavirus 2 by RT PCR: NEGATIVE

## 2021-03-30 MED ORDER — ACETAMINOPHEN 325 MG PO TABS: 650.0000 mg | ORAL_TABLET | Freq: Once | ORAL | Status: DC | PRN

## 2021-03-30 MED ORDER — ACETAMINOPHEN 500 MG PO TABS
1000.0000 mg | ORAL_TABLET | Freq: Once | ORAL | Status: AC | PRN
  Administered 2021-03-30: 1000 mg via ORAL
  Filled 2021-03-30 (×2): qty 2

## 2021-03-30 NOTE — ED Provider Notes (Signed)
Emergency Medicine Provider Triage Evaluation Note  Brittany Vargas , a 30 y.o. female  was evaluated in triage.  Pt complains of fever, chills, cough, body aches, nausea for the past 3 days.  She has been taking Tylenol at home.  She states that she has not been eating much because she feels nauseated the entire time.  She states she has vomited 1-2 times.  No diarrhea.  No abdominal pain.  She states she was around a friend who was sick however she states she is much sicker than her friend.  She has not been vaccinated for COVID or the flu.  Review of Systems  Positive: + fevers, chills, body aches, cough, decreased appetite, nausea, vomiting Negative: - diarrhea, abd pain  Physical Exam  BP 115/78 (BP Location: Right Arm)   Pulse (!) 123   Temp (!) 102.8 F (39.3 C) (Oral)   Resp 19   Ht 5\' 4"  (1.626 m)   Wt 89.8 kg   SpO2 99%   BMI 33.99 kg/m  Gen:   Awake, no distress  Resp:  Normal effort. Actively coughing.  MSK:   Moves extremities without difficulty  Other:  Tachycardic  Medical Decision Making  Medically screening exam initiated at 7:34 PM.  Appropriate orders placed.  LENNOX LEIKAM was informed that the remainder of the evaluation will be completed by another provider, this initial triage assessment does not replace that evaluation, and the importance of remaining in the ED until their evaluation is complete.     Gayleen Orem, PA-C 03/30/21 13/12/22, MD 03/31/21 [phone]

## 2021-03-30 NOTE — ED Triage Notes (Addendum)
BIB EMS for flu-like symptoms. Has not been eating or drinking well.  Symptoms x 3 days. No vomiting but positive for nausea.  Temp 102.8 in triage

## 2021-03-31 MED ORDER — ONDANSETRON 4 MG PO TBDP: 4.0000 mg | ORAL_TABLET | Freq: Three times a day (TID) | ORAL | 0 refills | Status: DC | PRN

## 2021-03-31 MED ORDER — ACETAMINOPHEN 325 MG PO TABS
650.0000 mg | ORAL_TABLET | Freq: Once | ORAL | Status: AC
  Administered 2021-03-31: 650 mg via ORAL
  Filled 2021-03-31: qty 2

## 2021-03-31 MED ORDER — BENZONATATE 100 MG PO CAPS: 100.0000 mg | ORAL_CAPSULE | Freq: Three times a day (TID) | ORAL | 0 refills | Status: DC

## 2021-03-31 NOTE — ED Provider Notes (Signed)
[facility] EMERGENCY DEPARTMENT Provider Note   CSN: [id number] Arrival date & time: 03/30/21  1921     History Chief Complaint  Patient presents with   Flu-Like Symptoms    Brittany Vargas is a 30 y.o. female with no pertinent past medical history presenting today with 3 days of URI symptoms.  She reports that she has been coughing, had subjective fevers, chills, nausea and chest tightness.  Multiple people at her job are sick.  She denies any recent long travel, hemoptysis, history of DVT, leg swelling.  Is on OCPs.  She has been utilizing over-the-counter Tylenol which she feels has stopped bringing down her fever.   Past Medical History:  Diagnosis Date   Thrombocytopenia [facility])     Patient Active Problem List   Diagnosis Date Noted   Thrombocytopenia complicating pregnancy (HCC) 12/05/2013    Past Surgical History:  Procedure Laterality Date   WISDOM TOOTH EXTRACTION       OB History     Gravida  5   Para  3   Term  3   Preterm      AB  2   Living  3      SAB      IAB  2   Ectopic      Multiple  0   Live Births  3           Family History  Problem Relation Age of Onset   Cancer Maternal Grandfather     Social History   Tobacco Use   Smoking status: Never   Smokeless tobacco: Never  Vaping Use   Vaping Use: Never used  Substance Use Topics   Alcohol use: No    Alcohol/week: 0.0 standard drinks   Drug use: No    Home Medications Prior to Admission medications   Medication Sig Start Date End Date Taking? Authorizing Provider  cephALEXin (KEFLEX) 500 MG capsule Take 1 capsule (500 mg total) by mouth 3 (three) times daily. Patient not taking: Reported on 11/23/2018 12/24/17   Bethel Born, PA-C  metroNIDAZOLE (FLAGYL) 500 MG tablet Take 1 tablet (500 mg total) by mouth 2 (two) times daily. 11/25/18   Raelyn Mora, CNM    Allergies    Patient has no known allergies.  Review of Systems   Review of Systems   Constitutional:  Positive for chills and fever.  HENT:  Negative for sore throat.   Respiratory:  Positive for cough.   Cardiovascular:  Negative for chest pain and palpitations.  Gastrointestinal:  Positive for nausea and vomiting. Negative for diarrhea.  Musculoskeletal:  Positive for myalgias.  All other systems reviewed and are negative.  Physical Exam Updated Vital Signs BP 114/85   Pulse (!) 117   Temp 98.9 F (37.2 C) (Oral)   Resp 17   Ht 5\' 4"  (1.626 m)   Wt 89.8 kg   SpO2 99%   BMI 33.99 kg/m   Physical Exam Vitals and nursing note reviewed.  Constitutional:      Appearance: Normal appearance.  HENT:     Head: Normocephalic and atraumatic.     Nose: No congestion or rhinorrhea.     Mouth/Throat:     Mouth: Mucous membranes are moist.     Pharynx: Oropharynx is clear.  Eyes:     General: No scleral icterus.    Conjunctiva/sclera: Conjunctivae normal.  Cardiovascular:     Rate and Rhythm: Regular rhythm. Tachycardia present.  Pulmonary:  Effort: Pulmonary effort is normal. No respiratory distress.     Breath sounds: No wheezing or rales.  Abdominal:     General: Abdomen is flat.     Palpations: Abdomen is soft.     Tenderness: There is no abdominal tenderness.  Lymphadenopathy:     Cervical: No cervical adenopathy.  Skin:    General: Skin is warm and dry.     Findings: No rash.  Neurological:     Mental Status: She is alert.  Psychiatric:        Mood and Affect: Mood normal.        Behavior: Behavior normal.    ED Results / Procedures / Treatments   Labs (all labs ordered are listed, but only abnormal results are displayed) Labs Reviewed  BASIC METABOLIC PANEL - Abnormal; Notable for the following components:      Result Value   Sodium 133 (*)    Glucose, Bld 128 (*)    Calcium 8.7 (*)    All other components within normal limits  CBC WITH DIFFERENTIAL/PLATELET - Abnormal; Notable for the following components:   WBC 11.9 (*)    Neutro  Abs 9.5 (*)    Monocytes Absolute 1.2 (*)    All other components within normal limits  URINALYSIS, ROUTINE W REFLEX MICROSCOPIC - Abnormal; Notable for the following components:   Hgb urine dipstick SMALL (*)    All other components within normal limits  RESP PANEL BY RT-PCR (FLU A&B, COVID) ARPGX2  I-STAT BETA HCG BLOOD, ED ([facility], [facility], [facility] ONLY)    EKG None  Radiology DG Chest 2 View  Result Date: 03/30/2021 CLINICAL DATA:  Cough, fever EXAM: CHEST - 2 VIEW COMPARISON:  12/24/2017 FINDINGS: Lungs are clear.  No pleural effusion or pneumothorax. The heart is normal in size. Visualized osseous structures are within normal limits. IMPRESSION: Normal chest radiographs. Electronically Signed   By: Charline Bills M.D.   On: 03/30/2021 20:03    Procedures Procedures   Medications Ordered in ED Medications  acetaminophen (TYLENOL) tablet 1,000 mg (1,000 mg Oral Given 03/30/21 1933)    ED Course  I have reviewed the triage vital signs and the nursing notes.  Pertinent labs & imaging results that were available during my care of the patient were reviewed by me and considered in my medical decision making (see chart for details).    MDM Rules/Calculators/A&P Patient was evaluated by me.  She was in no acute distress.  Tachycardic, likely secondary to viral illness however I did consider the possibility of pulmonary embolus.  In the setting of all of her other viral symptoms I believe this to be a lower likely diagnosis.  We discussed return signs and symptoms for pulmonary embolism.  COVID and flu testing was negative.  Blood work unremarkable.  Chest x-ray without signs of pneumonia.  At this time I believe the patient is stable enough to be discharged home with [facility] and Zofran.  She has received strict return precautions and a work note.  Final Clinical Impression(s) / ED Diagnoses Final diagnoses:  Flu-like symptoms    Rx / DC Orders Results and diagnoses were explained  to the patient. Return precautions discussed in full. Patient had no additional questions and expressed complete understanding.     Saddie Benders, PA-C 03/31/21 0815    Sloan Leiter, DO 03/31/21 1511

## 2021-03-31 NOTE — Discharge Instructions (Signed)
Please return if you have crushing chest pain, inability to catch her breath, large amounts of leg swelling or any overall worsening of your condition.  I have sent medication for nausea as well as cough to your pharmacy.  You may continue to use over-the-counter remedies such as Tylenol, ibuprofen and anything else that makes you feel better.  It was a pleasure to meet you and I hope that you feel better.

## 2021-06-16 ENCOUNTER — Emergency Department (HOSPITAL_COMMUNITY)
Admission: EM | Admit: 2021-06-16 | Discharge: 2021-06-16 | Disposition: A | Discharge status: Home / Self Care | Payer: Medicaid Other | Attending: Emergency Medicine | Admitting: Emergency Medicine

## 2021-06-16 ENCOUNTER — Other Ambulatory Visit: Payer: Self-pay

## 2021-06-16 DIAGNOSIS — K0889 Other specified disorders of teeth and supporting structures: Secondary | ICD-10-CM | POA: Diagnosis present

## 2021-06-16 DIAGNOSIS — K047 Periapical abscess without sinus: Secondary | ICD-10-CM | POA: Diagnosis not present

## 2021-06-16 DIAGNOSIS — K029 Dental caries, unspecified: Secondary | ICD-10-CM | POA: Diagnosis not present

## 2021-06-16 DIAGNOSIS — K0381 Cracked tooth: Secondary | ICD-10-CM | POA: Insufficient documentation

## 2021-06-16 MED ORDER — AMOXICILLIN-POT CLAVULANATE 875-125 MG PO TABS: 1.0000 | ORAL_TABLET | Freq: Two times a day (BID) | ORAL | 0 refills | Status: DC

## 2021-06-16 MED ORDER — NAPROXEN 500 MG PO TABS: 500.0000 mg | ORAL_TABLET | Freq: Two times a day (BID) | ORAL | 0 refills | Status: DC

## 2021-06-16 MED ORDER — KETOROLAC TROMETHAMINE 15 MG/ML IJ SOLN
15.0000 mg | Freq: Once | INTRAMUSCULAR | Status: AC
Start: 2021-06-16 — End: 2021-06-16
  Administered 2021-06-16: 15 mg via INTRAMUSCULAR
  Filled 2021-06-16: qty 1

## 2021-06-16 MED ORDER — LIDOCAINE VISCOUS HCL 2 % MT SOLN
15.0000 mL | OROMUCOSAL | 0 refills | Status: DC | PRN
Start: 2021-06-16 — End: 2022-02-18

## 2021-06-16 NOTE — ED Triage Notes (Signed)
Pt reports recurring infection to molar on right lower side of mouth, with pain recently becoming worse to the point she is unable to eat currently. Pt also reports pain to left lower jaw/molar with similar infection but right is worse than left. Pt says she was taking "a medicine for tooth infection from [place]" on and off for the last two years, possibly ampicillin. But states it did not work this time so reports to ED for pain.

## 2021-06-16 NOTE — ED Notes (Signed)
Pt verbalized understanding of d/c instructions, meds and followup care. Denies questions. VSS, no distress noted. Steady gait to exit with all belongings.  ?

## 2021-06-16 NOTE — Discharge Instructions (Signed)
Take antibiotics as prescribed.  Take entire course, even if symptoms improve.  Do not take ampicillin while taking this medicine. Take naproxen 2 times a day with meals.  Do not take other anti-inflammatories at the same time (Advil, Motrin, ibuprofen, Aleve). You may supplement with Tylenol if you need further pain control. Use of viscous lidocaine to help with pain control. You will need to follow-up with a dentist.  You may call the dentist listed below or look through the paperwork about dentists in the area.  Even if your symptoms improve, you need to follow-up as this is likely to recur. Return to the emergency room if you develop high fevers, inability to open up your mouth, inability swallow your own spit, or any new, worsening, or concerning symptoms

## 2021-06-16 NOTE — ED Provider Notes (Signed)
[facility] EMERGENCY DEPARTMENT Provider Note   CSN: [id number] Arrival date & time: 06/16/21  1035     History  Chief Complaint  Patient presents with   Dental Pain    Brittany Vargas is a 31 y.o. female presenting for evaluation of right tooth pain.  Patient states for the past 2 to 3 days she has had increased pain of her right lower tooth.  Last night it was so severe she was unable to eat.  She has had pain of this tooth intermittently for the past 2 years.  When she has pain, she gets an antibiotic, potentially ampicillin(?), which often resolves her pain.  She has not followed up with a dentist for her tooth.  No trauma or injury.  No fevers or chills.  No difficulty opening her mouth or swallowing.  She has been taking Tylenol without improvement of symptoms.  She had her first dose of abx last night without improvement  HPI     Home Medications Prior to Admission medications   Medication Sig Start Date End Date Taking? Authorizing Provider  amoxicillin-clavulanate (AUGMENTIN) 875-125 MG tablet Take 1 tablet by mouth every 12 (twelve) hours. 06/16/21  Yes Marquez Ceesay, PA-C  lidocaine (XYLOCAINE) 2 % solution Use as directed 15 mLs in the mouth or throat as needed for mouth pain. 06/16/21  Yes Carolann Brazell, PA-C  naproxen (NAPROSYN) 500 MG tablet Take 1 tablet (500 mg total) by mouth 2 (two) times daily with a meal. 06/16/21  Yes Rakhi Romagnoli, PA-C  benzonatate (TESSALON) 100 MG capsule Take 1 capsule (100 mg total) by mouth every 8 (eight) hours. 03/31/21   Redwine, Madison A, PA-C  cephALEXin (KEFLEX) 500 MG capsule Take 1 capsule (500 mg total) by mouth 3 (three) times daily. Patient not taking: Reported on 11/23/2018 12/24/17   Bethel Born, PA-C  metroNIDAZOLE (FLAGYL) 500 MG tablet Take 1 tablet (500 mg total) by mouth 2 (two) times daily. 11/25/18   Raelyn Mora, CNM  ondansetron (ZOFRAN ODT) 4 MG disintegrating tablet Take 1 tablet (4  mg total) by mouth every 8 (eight) hours as needed for nausea or vomiting. 03/31/21   Redwine, Madison A, PA-C      Allergies    Patient has no known allergies.    Review of Systems   Review of Systems  Constitutional:  Negative for fever.  HENT:  Positive for dental problem.    Physical Exam Updated Vital Signs BP (!) 128/92 (BP Location: Right Arm)    Pulse 76    Temp 98.5 F (36.9 C) (Oral)    Resp 18    Ht 5\' 4"  (1.626 m)    Wt 90 kg    SpO2 100%    BMI 34.06 kg/m  Physical Exam Vitals and nursing note reviewed.  Constitutional:      General: She is not in acute distress.    Appearance: She is well-developed.  HENT:     Head: Normocephalic and atraumatic.     Mouth/Throat:     Dentition: Abnormal dentition. Dental tenderness, gingival swelling and dental caries present.      Comments: Right lower tooth with obvious cavity and tenderness.  Surrounding gum erythema and edema. Left lower tooth cracked, but not tender. No trismus.  No pain under the tongue.  No significant swelling. Eyes:     Extraocular Movements: Extraocular movements intact.  Cardiovascular:     Rate and Rhythm: Normal rate.  Pulmonary:  Effort: Pulmonary effort is normal.  Abdominal:     General: There is no distension.  Musculoskeletal:        General: Normal range of motion.     Cervical back: Normal range of motion.  Skin:    General: Skin is warm.     Findings: No rash.  Neurological:     Mental Status: She is alert and oriented to person, place, and time.    ED Results / Procedures / Treatments   Labs (all labs ordered are listed, but only abnormal results are displayed) Labs Reviewed - No data to display  EKG None  Radiology No results found.  Procedures Procedures    Medications Ordered in ED Medications  ketorolac (TORADOL) 15 MG/ML injection 15 mg (15 mg Intramuscular Given 06/16/21 1143)    ED Course/ Medical Decision Making/ A&P                           Medical  Decision Making Risk Prescription drug management.    This patient presents to the ED for concern of dental pain. This involves a number of treatment options, and is a complaint that carries with it a low risk of complications and morbidity.  The differential diagnosis includes dental infection, dental nerve exposure, deep space infection such as Ludwig's.  Medicines ordered:  I ordered medication including Toradol for pain.  Patient drove her self here, unable to get narcotics in the ER   Disposition:  After consideration of the diagnostic results and the patients response to treatment, I feel that the patent would benefit from outpatient management with antibiotics and pain control.  We will have patient follow-up with dentistry, resources given.  Discussed with patient importance of taking the entire course of antibiotics.  Discussed that even if symptoms improve, she should follow-up with dentistry as she will likely have recurrence of symptoms.  On exam, there is no evidence of deep space infection, do not feel she needs further emergent testing including labs or imaging.  At this time, patient appears safe for discharge.  Return precautions given.  Patient states she understands and agrees to plan   Final Clinical Impression(s) / ED Diagnoses Final diagnoses:  Pain, dental  Dental infection    Rx / DC Orders ED Discharge Orders          Ordered    amoxicillin-clavulanate (AUGMENTIN) 875-125 MG tablet  Every 12 hours        06/16/21 1139    lidocaine (XYLOCAINE) 2 % solution  As needed        06/16/21 1139    naproxen (NAPROSYN) 500 MG tablet  2 times daily with meals        06/16/21 1139              Johnthomas Lader, PA-C 06/16/21 1154    Terrilee Files, MD 06/17/21 [phone]

## 2021-12-18 ENCOUNTER — Emergency Department (HOSPITAL_COMMUNITY)
Admission: EM | Admit: 2021-12-18 | Discharge: 2021-12-18 | Disposition: A | Discharge status: Home / Self Care | Payer: Medicaid Other | Attending: Emergency Medicine | Admitting: Emergency Medicine

## 2021-12-18 ENCOUNTER — Other Ambulatory Visit: Payer: Self-pay

## 2021-12-18 DIAGNOSIS — K0889 Other specified disorders of teeth and supporting structures: Secondary | ICD-10-CM | POA: Diagnosis not present

## 2021-12-18 MED ORDER — KETOROLAC TROMETHAMINE 30 MG/ML IJ SOLN
30.0000 mg | Freq: Once | INTRAMUSCULAR | Status: AC
  Administered 2021-12-18: 30 mg via INTRAMUSCULAR
  Filled 2021-12-18: qty 1

## 2021-12-18 MED ORDER — PENICILLIN V POTASSIUM 250 MG PO TABS
500.0000 mg | ORAL_TABLET | Freq: Once | ORAL | Status: AC
  Administered 2021-12-18: 500 mg via ORAL
  Filled 2021-12-18: qty 2

## 2021-12-18 MED ORDER — PENICILLIN V POTASSIUM 500 MG PO TABS: 500.0000 mg | ORAL_TABLET | Freq: Four times a day (QID) | ORAL | 0 refills | Status: DC

## 2021-12-18 NOTE — ED Notes (Signed)
Pt verbalized understanding of d/c instructions, meds, and followup care. Denies questions. VSS, no distress noted. Steady gait to exit with all belongings.  ?

## 2021-12-18 NOTE — ED Triage Notes (Signed)
Pt here for R side dental pain, pt has known broken tooth that she has been on antibiotics for in the past. Pt denies fevers. Pt has been using tylenol at home w/o relief, states pain radiates into ear.

## 2021-12-18 NOTE — ED Provider Notes (Signed)
[facility] Emergency Department Provider Note MRN:  [id number]  Arrival date & time: 12/18/21     Chief Complaint   Dental Pain   History of Present Illness   Brittany Vargas is a 31 y.o. year-old female presents to the ED with chief complaint of dental pain.  Reports broken tooth from around a year ago.  Doesn't have a dentist.  Has had some worsening pain in the past few days with pain that radiates to her ear.  History provided by patient.   Review of Systems  Pertinent review of systems noted in HPI.    Physical Exam   Vitals:   12/18/21 0051 12/18/21 0237  BP: (!) 125/93 126/86  Pulse: 70 68  Resp: 18 16  Temp: 97.7 F (36.5 C) 98 F (36.7 C)  SpO2: 99% 99%    Physical Exam  Constitutional: Pt appears well-developed and well-nourished.  HENT:  Poor dentition No gingival swelling, fluctuance or induration No gross abscess  No sublingual edema, tenderness to palpation, or sign of Ludwig's angina, or deep space infection Pain at tooth number 29 Eyes: Conjunctivae are normal. Pupils are equal, round, and reactive to light. Right eye exhibits no discharge. Left eye exhibits no discharge.  Neck: Normal range of motion. Neck supple.  No stridor Handling secretions without difficulty No nuchal rigidity No cervical lymphadenopathy Cardiovascular: Normal rate, Pulmonary/Chest: Effort normal.  Equal chest rise  Abdominal: Pt exhibits no distension.  Neurological: Pt is alert and oriented x 4  Skin: Skin is warm and dry.  Psychiatric: Pt has a normal mood and affect.  Nursing note and vitals reviewed.    *Additional and/or pertinent findings included in MDM below  Diagnostic and Interventional Summary    EKG Interpretation  Date/Time:    Ventricular Rate:    PR Interval:    QRS Duration:   QT Interval:    QTC Calculation:   R Axis:     Text Interpretation:         Labs Reviewed - No data to display  No orders to display     Medications  ketorolac (TORADOL) 30 MG/ML injection 30 mg (has no administration in time range)  penicillin v potassium (VEETID) tablet 500 mg (has no administration in time range)     Procedures  /  Critical Care Procedures  ED Course and Medical Decision Making  I have reviewed the triage vital signs, the nursing notes, and pertinent available records from the EMR.  Social Determinants Affecting Complexity of Care: Patient has no clinically significant social determinants affecting this chief complaint..   ED Course:    Medical Decision Making Patient with dentalgia.  No abscess requiring immediate incision and drainage.  Exam not concerning for Ludwig's angina or pharyngeal abscess.  Will treat with penicillin. Pt instructed to follow-up with dentist.  Discussed return precautions. Pt safe for discharge.   Problems Addressed: Pain, dental: acute illness or injury  Risk Prescription drug management.     Consultants: No consultations were needed in caring for this patient.   Treatment and Plan: Emergency department workup does not suggest an emergent condition requiring admission or immediate intervention beyond  what has been performed at this time. The patient is safe for discharge and has  been instructed to return immediately for worsening symptoms, change in  symptoms or any other concerns    Final Clinical Impressions(s) / ED Diagnoses     ICD-10-CM   1. Pain, dental  K08.89  ED Discharge Orders          Ordered    penicillin v potassium (VEETID) 500 MG tablet  4 times daily        12/18/21 0244              Discharge Instructions Discussed with and Provided to Patient:   Discharge Instructions   None      Roxy Horseman, PA-C 12/18/21 4742    Alvira Monday, MD 12/18/21 2326

## 2022-01-14 ENCOUNTER — Ambulatory Visit (INDEPENDENT_AMBULATORY_CARE_PROVIDER_SITE_OTHER): Payer: Medicaid Other

## 2022-01-14 ENCOUNTER — Other Ambulatory Visit (HOSPITAL_COMMUNITY)
Admission: RE | Admit: 2022-01-14 | Discharge: 2022-01-14 | Disposition: A | Discharge status: Home / Self Care | Payer: Medicaid Other | Source: Ambulatory Visit | Attending: Obstetrics and Gynecology | Admitting: Obstetrics and Gynecology

## 2022-01-14 VITALS — BP 108/72 | HR 76 | Ht 64.0 in | Wt 197.0 lb

## 2022-01-14 DIAGNOSIS — Z202 Contact with and (suspected) exposure to infections with a predominantly sexual mode of transmission: Secondary | ICD-10-CM

## 2022-01-14 NOTE — Progress Notes (Addendum)
  SUBJECTIVE:   31 y.o. female complains of possible exposure, her partner has penile discharge. for 2 week(s). Denies abnormal vaginal bleeding or significant pelvic pain or fever. No UTI symptoms. Denies history of known exposure to STD.  Patient's last menstrual period was 12/21/2021 (exact date).  OBJECTIVE:  She appears well, afebrile. Urine dipstick: not done.  ASSESSMENT:  Possible exposure  PLAN:  GC, chlamydia, trichomonas, BVAG, CVAG probe sent to lab. Treatment: To be determined once lab results are received ROV prn if symptoms persist or worsen.

## 2022-01-15 LAB — CERVICOVAGINAL ANCILLARY ONLY
Bacterial Vaginitis (gardnerella): POSITIVE — AB
Candida Glabrata: NEGATIVE
Candida Vaginitis: POSITIVE — AB
Chlamydia: NEGATIVE
Comment: NEGATIVE
Comment: NEGATIVE
Comment: NEGATIVE
Comment: NEGATIVE
Comment: NEGATIVE
Comment: NORMAL
Neisseria Gonorrhea: NEGATIVE
Trichomonas: NEGATIVE

## 2022-01-16 ENCOUNTER — Other Ambulatory Visit: Payer: Self-pay

## 2022-01-16 DIAGNOSIS — B379 Candidiasis, unspecified: Secondary | ICD-10-CM

## 2022-01-16 DIAGNOSIS — B9689 Other specified bacterial agents as the cause of diseases classified elsewhere: Secondary | ICD-10-CM

## 2022-01-16 MED ORDER — METRONIDAZOLE 500 MG PO TABS: 500.0000 mg | ORAL_TABLET | Freq: Two times a day (BID) | ORAL | 0 refills | Status: DC

## 2022-01-16 MED ORDER — FLUCONAZOLE 150 MG PO TABS: 150.0000 mg | ORAL_TABLET | Freq: Once | ORAL | 0 refills | Status: AC

## 2022-02-15 ENCOUNTER — Other Ambulatory Visit: Payer: Self-pay

## 2022-02-15 ENCOUNTER — Emergency Department (HOSPITAL_COMMUNITY): Payer: Medicaid Other

## 2022-02-15 ENCOUNTER — Emergency Department (HOSPITAL_COMMUNITY)
Admission: EM | Admit: 2022-02-15 | Discharge: 2022-02-16 | Disposition: A | Discharge status: Home / Self Care | Payer: Medicaid Other | Attending: Emergency Medicine | Admitting: Emergency Medicine

## 2022-02-15 DIAGNOSIS — S3991XA Unspecified injury of abdomen, initial encounter: Secondary | ICD-10-CM | POA: Diagnosis present

## 2022-02-15 DIAGNOSIS — S5011XA Contusion of right forearm, initial encounter: Secondary | ICD-10-CM | POA: Insufficient documentation

## 2022-02-15 DIAGNOSIS — R519 Headache, unspecified: Secondary | ICD-10-CM | POA: Diagnosis not present

## 2022-02-15 DIAGNOSIS — Y92410 Unspecified street and highway as the place of occurrence of the external cause: Secondary | ICD-10-CM | POA: Insufficient documentation

## 2022-02-15 DIAGNOSIS — S301XXA Contusion of abdominal wall, initial encounter: Secondary | ICD-10-CM | POA: Diagnosis not present

## 2022-02-15 DIAGNOSIS — N9489 Other specified conditions associated with female genital organs and menstrual cycle: Secondary | ICD-10-CM | POA: Insufficient documentation

## 2022-02-15 LAB — I-STAT CHEM 8, ED
BUN: 13 mg/dL (ref 6–20)
Calcium, Ion: 1.12 mmol/L — ABNORMAL LOW (ref 1.15–1.40)
Chloride: 103 mmol/L (ref 98–111)
Creatinine, Ser: 0.6 mg/dL (ref 0.44–1.00)
Glucose, Bld: 102 mg/dL — ABNORMAL HIGH (ref 70–99)
HCT: 42 % (ref 36.0–46.0)
Hemoglobin: 14.3 g/dL (ref 12.0–15.0)
Potassium: 3.4 mmol/L — ABNORMAL LOW (ref 3.5–5.1)
Sodium: 139 mmol/L (ref 135–145)
TCO2: 24 mmol/L (ref 22–32)

## 2022-02-15 LAB — CBC WITH DIFFERENTIAL/PLATELET
Abs Immature Granulocytes: 0.03 10*3/uL (ref 0.00–0.07)
Basophils Absolute: 0.1 10*3/uL (ref 0.0–0.1)
Basophils Relative: 1 %
Eosinophils Absolute: 0.3 10*3/uL (ref 0.0–0.5)
Eosinophils Relative: 4 %
HCT: 40.3 % (ref 36.0–46.0)
Hemoglobin: 13.7 g/dL (ref 12.0–15.0)
Immature Granulocytes: 0 %
Lymphocytes Relative: 30 %
Lymphs Abs: 2.5 10*3/uL (ref 0.7–4.0)
MCH: 32 pg (ref 26.0–34.0)
MCHC: 34 g/dL (ref 30.0–36.0)
MCV: 94.2 fL (ref 80.0–100.0)
Monocytes Absolute: 0.7 10*3/uL (ref 0.1–1.0)
Monocytes Relative: 8 %
Neutro Abs: 4.8 10*3/uL (ref 1.7–7.7)
Neutrophils Relative %: 57 %
Platelets: 170 10*3/uL (ref 150–400)
RBC: 4.28 MIL/uL (ref 3.87–5.11)
RDW: 13.3 % (ref 11.5–15.5)
WBC: 8.3 10*3/uL (ref 4.0–10.5)
nRBC: 0 % (ref 0.0–0.2)

## 2022-02-15 LAB — I-STAT BETA HCG BLOOD, ED (MC, WL, AP ONLY): I-stat hCG, quantitative: <5 m[IU]/mL (ref <5)
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
## 2022-02-16 ENCOUNTER — Emergency Department (HOSPITAL_COMMUNITY): Payer: Medicaid Other

## 2022-02-16 LAB — COMPREHENSIVE METABOLIC PANEL
ALT: 24 U/L (ref 0–44)
AST: 23 U/L (ref 15–41)
Albumin: 4.1 g/dL (ref 3.5–5.0)
Alkaline Phosphatase: 61 U/L (ref 38–126)
Anion gap: 7 (ref 5–15)
BUN: 12 mg/dL (ref 6–20)
CO2: 24 mmol/L (ref 22–32)
Calcium: 9 mg/dL (ref 8.9–10.3)
Chloride: 105 mmol/L (ref 98–111)
Creatinine, Ser: 0.76 mg/dL (ref 0.44–1.00)
GFR, Estimated: >60 mL/min (ref >60)
Glucose, Bld: 106 mg/dL — ABNORMAL HIGH (ref 70–99)
Potassium: 3.3 mmol/L — ABNORMAL LOW (ref 3.5–5.1)
Sodium: 136 mmol/L (ref 135–145)
Total Bilirubin: 0.6 mg/dL (ref 0.3–1.2)
Total Protein: 7.5 g/dL (ref 6.5–8.1)

## 2022-02-16 MED ORDER — KETOROLAC TROMETHAMINE 15 MG/ML IJ SOLN
15.0000 mg | Freq: Once | INTRAMUSCULAR | Status: AC
  Administered 2022-02-16: 15 mg via INTRAVENOUS
  Filled 2022-02-16: qty 1

## 2022-02-16 MED ORDER — IOHEXOL 350 MG/ML SOLN
75.0000 mL | Freq: Once | INTRAVENOUS | Status: AC | PRN
  Administered 2022-02-16: 75 mL via INTRAVENOUS

## 2022-02-16 NOTE — ED Notes (Signed)
Patient transported to CT 
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
## 2022-02-18 ENCOUNTER — Encounter (HOSPITAL_COMMUNITY): Payer: Self-pay

## 2022-02-18 ENCOUNTER — Ambulatory Visit (HOSPITAL_COMMUNITY)
Admission: RE | Admit: 2022-02-18 | Discharge: 2022-02-18 | Disposition: A | Discharge status: Home / Self Care | Payer: Medicaid Other | Source: Ambulatory Visit | Attending: Emergency Medicine | Admitting: Emergency Medicine

## 2022-02-18 DIAGNOSIS — M7918 Myalgia, other site: Secondary | ICD-10-CM

## 2022-02-18 MED ORDER — LIDOCAINE 5 % EX PTCH: 1.0000 | MEDICATED_PATCH | CUTANEOUS | 0 refills | Status: DC

## 2022-02-18 MED ORDER — CYCLOBENZAPRINE HCL 10 MG PO TABS: 10.0000 mg | ORAL_TABLET | Freq: Two times a day (BID) | ORAL | 0 refills | Status: AC | PRN

## 2022-02-18 MED ORDER — CYCLOBENZAPRINE HCL 10 MG PO TABS: 10.0000 mg | ORAL_TABLET | Freq: Two times a day (BID) | ORAL | 0 refills | Status: DC | PRN

## 2022-02-18 MED ORDER — LIDOCAINE 5 % EX PTCH: 1.0000 | MEDICATED_PATCH | CUTANEOUS | 0 refills | Status: AC
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
## 2022-02-18 NOTE — Discharge Instructions (Signed)
I recommend continuing ibuprofen every 6 hours as needed. You can take the muscle relaxer twice daily. If it makes you drowsy, take only at bedtime. You can apply the lidocaine patches for 12 hours at a time.  If needed you can follow up with the orthopedic specialists. Please return to the urgent care if symptoms do not improve, or go to the emergency department if symptoms worsen.

## 2022-11-10 ENCOUNTER — Ambulatory Visit (INDEPENDENT_AMBULATORY_CARE_PROVIDER_SITE_OTHER): Payer: Medicaid Other

## 2022-11-10 ENCOUNTER — Other Ambulatory Visit (HOSPITAL_COMMUNITY)
Admission: RE | Admit: 2022-11-10 | Discharge: 2022-11-10 | Disposition: A | Discharge status: Home / Self Care | Payer: Medicaid Other | Source: Ambulatory Visit | Attending: Obstetrics and Gynecology | Admitting: Obstetrics and Gynecology

## 2022-11-10 VITALS — BP 109/71 | HR 87 | Ht 64.0 in | Wt 190.0 lb

## 2022-11-10 DIAGNOSIS — N898 Other specified noninflammatory disorders of vagina: Secondary | ICD-10-CM | POA: Insufficient documentation

## 2022-11-10 DIAGNOSIS — Z113 Encounter for screening for infections with a predominantly sexual mode of transmission: Secondary | ICD-10-CM | POA: Diagnosis present

## 2022-11-10 NOTE — Progress Notes (Signed)
SUBJECTIVE:  32 y.o. female complains of milky vaginal discharge for 3 week(s). Denies abnormal vaginal bleeding or significant pelvic pain or fever. No UTI symptoms. Denies history of known exposure to STD.  No LMP recorded. (Menstrual status: IUD).  OBJECTIVE:  She appears well, afebrile. Urine dipstick: not done.  ASSESSMENT:  Vaginal Discharge  Screening for STDs   PLAN:  GC, chlamydia, trichomonas, BVAG, CVAG probe sent to lab. Treatment: To be determined once lab results are received ROV prn if symptoms persist or worsen.

## 2022-11-11 LAB — CERVICOVAGINAL ANCILLARY ONLY
Bacterial Vaginitis (gardnerella): POSITIVE — AB
Candida Glabrata: NEGATIVE
Candida Vaginitis: NEGATIVE
Chlamydia: NEGATIVE
Comment: NEGATIVE
Comment: NEGATIVE
Comment: NEGATIVE
Comment: NEGATIVE
Comment: NEGATIVE
Comment: NORMAL
Neisseria Gonorrhea: NEGATIVE
Trichomonas: NEGATIVE

## 2022-11-12 ENCOUNTER — Other Ambulatory Visit: Payer: Self-pay

## 2022-11-12 DIAGNOSIS — B9689 Other specified bacterial agents as the cause of diseases classified elsewhere: Secondary | ICD-10-CM

## 2022-11-12 MED ORDER — METRONIDAZOLE 500 MG PO TABS: 500.0000 mg | ORAL_TABLET | Freq: Two times a day (BID) | ORAL | 0 refills | Status: DC

## 2023-01-01 ENCOUNTER — Other Ambulatory Visit: Payer: Self-pay | Admitting: Obstetrics and Gynecology

## 2023-01-01 DIAGNOSIS — N760 Acute vaginitis: Secondary | ICD-10-CM
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
## 2023-01-05 ENCOUNTER — Ambulatory Visit: Payer: Medicaid Other

## 2023-01-07 ENCOUNTER — Ambulatory Visit
Admission: RE | Admit: 2023-01-07 | Discharge: 2023-01-07 | Disposition: A | Discharge status: Home / Self Care | Payer: Medicaid Other | Source: Ambulatory Visit | Attending: Internal Medicine | Admitting: Internal Medicine

## 2023-01-07 VITALS — BP 121/86 | HR 59 | Temp 98.4°F | Resp 16

## 2023-01-07 DIAGNOSIS — N760 Acute vaginitis: Secondary | ICD-10-CM | POA: Insufficient documentation

## 2023-01-07 DIAGNOSIS — B9689 Other specified bacterial agents as the cause of diseases classified elsewhere: Secondary | ICD-10-CM | POA: Diagnosis not present

## 2023-01-07 MED ORDER — METRONIDAZOLE 500 MG PO TABS
500.0000 mg | ORAL_TABLET | Freq: Two times a day (BID) | ORAL | 0 refills | Status: DC
Start: 2023-01-07 — End: 2023-07-09

## 2023-01-07 MED ORDER — FLUCONAZOLE 150 MG PO TABS: 150.0000 mg | ORAL_TABLET | ORAL | 0 refills | Status: DC

## 2023-01-07 NOTE — ED Triage Notes (Signed)
Pt reports yellow vaginal discharge x 7 days.

## 2023-01-07 NOTE — ED Provider Notes (Signed)
  [facility]  Note:  This document was prepared using Conservation officer, historic buildings and may include unintentional dictation errors.  MRN: [id number] DOB: 12-11-1990  Subjective:   Brittany Vargas is a 32 y.o. female presenting for 7-day history of acute onset recurrent yellow thick vaginal discharge.  Has history of BV and yeast infection.  Last episode of BV was 11/10/2022.  Denies fever, n/v, abdominal pain, pelvic pain, rashes, dysuria, urinary frequency, hematuria.    No current facility-administered medications for this encounter.  Current Outpatient Medications:    lidocaine (LIDODERM) 5 %, Place 1 patch onto the skin daily. Remove & Discard patch within 12 hours (Patient not taking: Reported on 11/10/2022), Disp: 15 patch, Rfl: 0   metroNIDAZOLE (FLAGYL) 500 MG tablet, Take 1 tablet (500 mg total) by mouth 2 (two) times daily., Disp: 14 tablet, Rfl: 0   No Known Allergies  Past Medical History:  Diagnosis Date   Thrombocytopenia (HCC)      Past Surgical History:  Procedure Laterality Date   WISDOM TOOTH EXTRACTION      Family History  Problem Relation Age of Onset   Cancer Maternal Grandfather     Social History   Tobacco Use   Smoking status: Never   Smokeless tobacco: Never  Vaping Use   Vaping status: Never Used  Substance Use Topics   Alcohol use: No    Alcohol/week: 0.0 standard drinks of alcohol   Drug use: No    ROS   Objective:   Vitals: BP 121/86 (BP Location: Left Arm)   Pulse (!) 59   Temp 98.4 F (36.9 C) (Oral)   Resp 16   LMP  (Within Weeks)   SpO2 98%   Physical Exam Constitutional:      General: She is not in acute distress.    Appearance: Normal appearance. She is well-developed. She is not ill-appearing, toxic-appearing or diaphoretic.  HENT:     Head: Normocephalic and atraumatic.     Nose: Nose normal.     Mouth/Throat:     Mouth: Mucous membranes are moist.  Eyes:     General: No scleral  icterus.       Right eye: No discharge.        Left eye: No discharge.     Extraocular Movements: Extraocular movements intact.  Cardiovascular:     Rate and Rhythm: Normal rate.  Pulmonary:     Effort: Pulmonary effort is normal.  Skin:    General: Skin is warm and dry.  Neurological:     General: No focal deficit present.     Mental Status: She is alert and oriented to person, place, and time.  Psychiatric:        Mood and Affect: Mood normal.        Behavior: Behavior normal.     Assessment and Plan :   PDMP not reviewed this encounter.  1. Bacterial vaginosis   2. Acute vaginitis    Patient politely declined UPT, no concern for pregnancy.  We will treat patient empirically for bacterial vaginosis with Flagyl and for yeast vaginitis with fluconazole.  Labs pending. Counseled patient on potential for adverse effects with medications prescribed/recommended today, ER and return-to-clinic precautions discussed, patient verbalized understanding.    Wallis Bamberg, [place] 01/07/23 [id number]

## 2023-01-08 ENCOUNTER — Ambulatory Visit: Payer: Medicaid Other | Admitting: Obstetrics and Gynecology

## 2023-01-08 LAB — CERVICOVAGINAL ANCILLARY ONLY
Bacterial Vaginitis (gardnerella): POSITIVE — AB
Candida Glabrata: NEGATIVE
Candida Vaginitis: NEGATIVE
Chlamydia: NEGATIVE
Comment: NEGATIVE
Comment: NEGATIVE
Comment: NEGATIVE
Comment: NEGATIVE
Comment: NEGATIVE
Comment: NORMAL
Neisseria Gonorrhea: NEGATIVE
Trichomonas: NEGATIVE

## 2023-01-20 ENCOUNTER — Ambulatory Visit: Payer: Medicaid Other | Admitting: Gastroenterology

## 2023-03-12 ENCOUNTER — Ambulatory Visit: Payer: Medicaid Other

## 2023-07-09 ENCOUNTER — Ambulatory Visit
Admission: RE | Admit: 2023-07-09 | Discharge: 2023-07-09 | Disposition: A | Discharge status: Home / Self Care | Payer: Medicaid Other | Source: Ambulatory Visit | Attending: Family Medicine | Admitting: Family Medicine

## 2023-07-09 VITALS — BP 115/55 | HR 67 | Temp 98.0°F | Resp 16

## 2023-07-09 DIAGNOSIS — N760 Acute vaginitis: Secondary | ICD-10-CM | POA: Diagnosis not present

## 2023-07-09 DIAGNOSIS — B9689 Other specified bacterial agents as the cause of diseases classified elsewhere: Secondary | ICD-10-CM | POA: Insufficient documentation

## 2023-07-09 MED ORDER — FLUCONAZOLE 150 MG PO TABS: 150.0000 mg | ORAL_TABLET | ORAL | 0 refills | Status: AC

## 2023-07-09 MED ORDER — METRONIDAZOLE 500 MG PO TABS
500.0000 mg | ORAL_TABLET | Freq: Two times a day (BID) | ORAL | 0 refills | Status: DC
Start: 2023-07-09 — End: 2023-10-26

## 2023-07-09 NOTE — ED Triage Notes (Signed)
Pt c/o vaginal d/c x 1 week-NAD-steady gait 

## 2023-07-09 NOTE — ED Provider Notes (Signed)
 [facility]  Note:  This document was prepared using Conservation officer, historic buildings and may include unintentional dictation errors.  MRN: [id number] DOB: 1991/04/23  Subjective:   Brittany Vargas is a 33 y.o. female presenting for 1 week history of persistent vaginal discharge.  Has a history of bacterial vaginosis and yeast infections.  Denies fever, n/v, abdominal pain, pelvic pain, rashes, dysuria, urinary frequency, hematuria.    No current facility-administered medications for this encounter.  Current Outpatient Medications:    fluconazole (DIFLUCAN) 150 MG tablet, Take 1 tablet (150 mg total) by mouth every 3 (three) days., Disp: 5 tablet, Rfl: 0   lidocaine (LIDODERM) 5 %, Place 1 patch onto the skin daily. Remove & Discard patch within 12 hours (Patient not taking: Reported on 11/10/2022), Disp: 15 patch, Rfl: 0   metroNIDAZOLE (FLAGYL) 500 MG tablet, Take 1 tablet (500 mg total) by mouth 2 (two) times daily., Disp: 14 tablet, Rfl: 0   No Known Allergies  Past Medical History:  Diagnosis Date   Thrombocytopenia (HCC)      Past Surgical History:  Procedure Laterality Date   WISDOM TOOTH EXTRACTION      Family History  Problem Relation Age of Onset   Cancer Maternal Grandfather     Social History   Tobacco Use   Smoking status: Never   Smokeless tobacco: Never  Vaping Use   Vaping status: Never Used  Substance Use Topics   Alcohol use: Not Currently    Comment: occ   Drug use: No    ROS   Objective:   Vitals: BP (!) 115/55 (BP Location: Right Arm)   Pulse 67   Temp 98 F (36.7 C) (Oral)   Resp 16   SpO2 99%   Physical Exam Constitutional:      General: She is not in acute distress.    Appearance: Normal appearance. She is well-developed. She is not ill-appearing, toxic-appearing or diaphoretic.  HENT:     Head: Normocephalic and atraumatic.     Nose: Nose normal.     Mouth/Throat:     Mouth: Mucous membranes are  moist.  Eyes:     General: No scleral icterus.       Right eye: No discharge.        Left eye: No discharge.     Extraocular Movements: Extraocular movements intact.     Conjunctiva/sclera: Conjunctivae normal.  Cardiovascular:     Rate and Rhythm: Normal rate.  Pulmonary:     Effort: Pulmonary effort is normal.  Abdominal:     General: Bowel sounds are normal. There is no distension.     Palpations: Abdomen is soft. There is no mass.     Tenderness: There is no abdominal tenderness. There is no right CVA tenderness, left CVA tenderness, guarding or rebound.  Skin:    General: Skin is warm and dry.  Neurological:     General: No focal deficit present.     Mental Status: She is alert and oriented to person, place, and time.  Psychiatric:        Mood and Affect: Mood normal.        Behavior: Behavior normal.        Thought Content: Thought content normal.        Judgment: Judgment normal.     Assessment and Plan :   PDMP not reviewed this encounter.  1. Bacterial vaginosis   2. Acute vaginitis   3. BV (bacterial  vaginosis)    Declined pregnancy test, has no concern for this.  We will treat patient empirically for bacterial vaginosis with Flagyl and for yeast vaginitis with fluconazole.  Labs pending. Counseled patient on potential for adverse effects with medications prescribed/recommended today, ER and return-to-clinic precautions discussed, patient verbalized understanding.    Wallis Bamberg, [place] 07/09/23 1124

## 2023-07-09 NOTE — Discharge Instructions (Signed)
 We will treat you for a recurring bacterial vaginosis and yeast infection with metronidazole and fluconazole. As your results come back to our nurse, she will reach out to you for review.

## 2023-07-10 ENCOUNTER — Telehealth: Payer: Self-pay

## 2023-07-10 LAB — CERVICOVAGINAL ANCILLARY ONLY
Bacterial Vaginitis (gardnerella): POSITIVE — AB
Candida Glabrata: NEGATIVE
Candida Vaginitis: NEGATIVE
Chlamydia: NEGATIVE
Comment: NEGATIVE
Comment: NEGATIVE
Comment: NEGATIVE
Comment: NEGATIVE
Comment: NEGATIVE
Comment: NORMAL
Neisseria Gonorrhea: NEGATIVE
Trichomonas: NEGATIVE

## 2023-07-10 NOTE — Telephone Encounter (Signed)
 Created in error

## 2023-09-17 IMAGING — DX DG CHEST 2V
2 series · 2 of 2 positions shown · non-contrast
Comparison: 12/24/2017

CLINICAL DATA: Cough, fever

EXAM:
CHEST - 2 VIEW

[w chest pa]
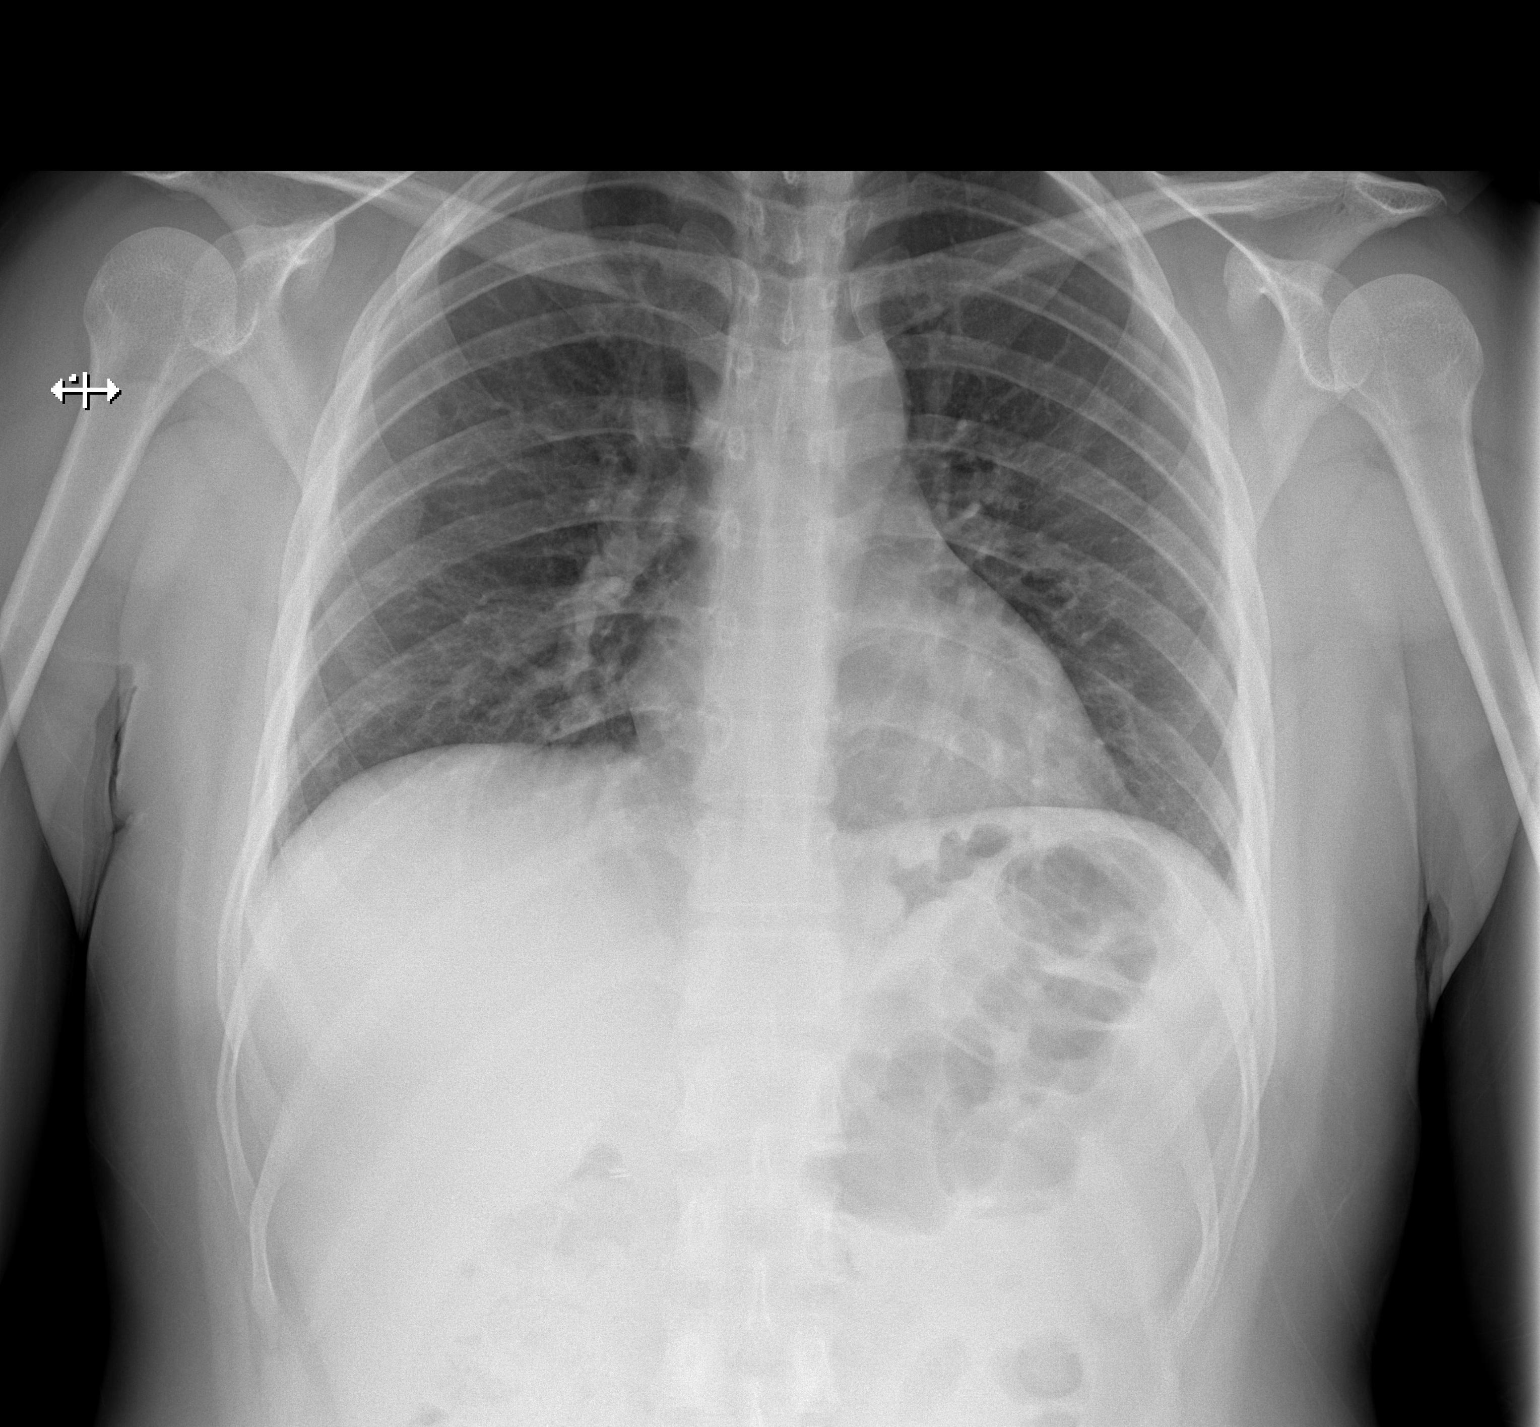

[w chest lat]
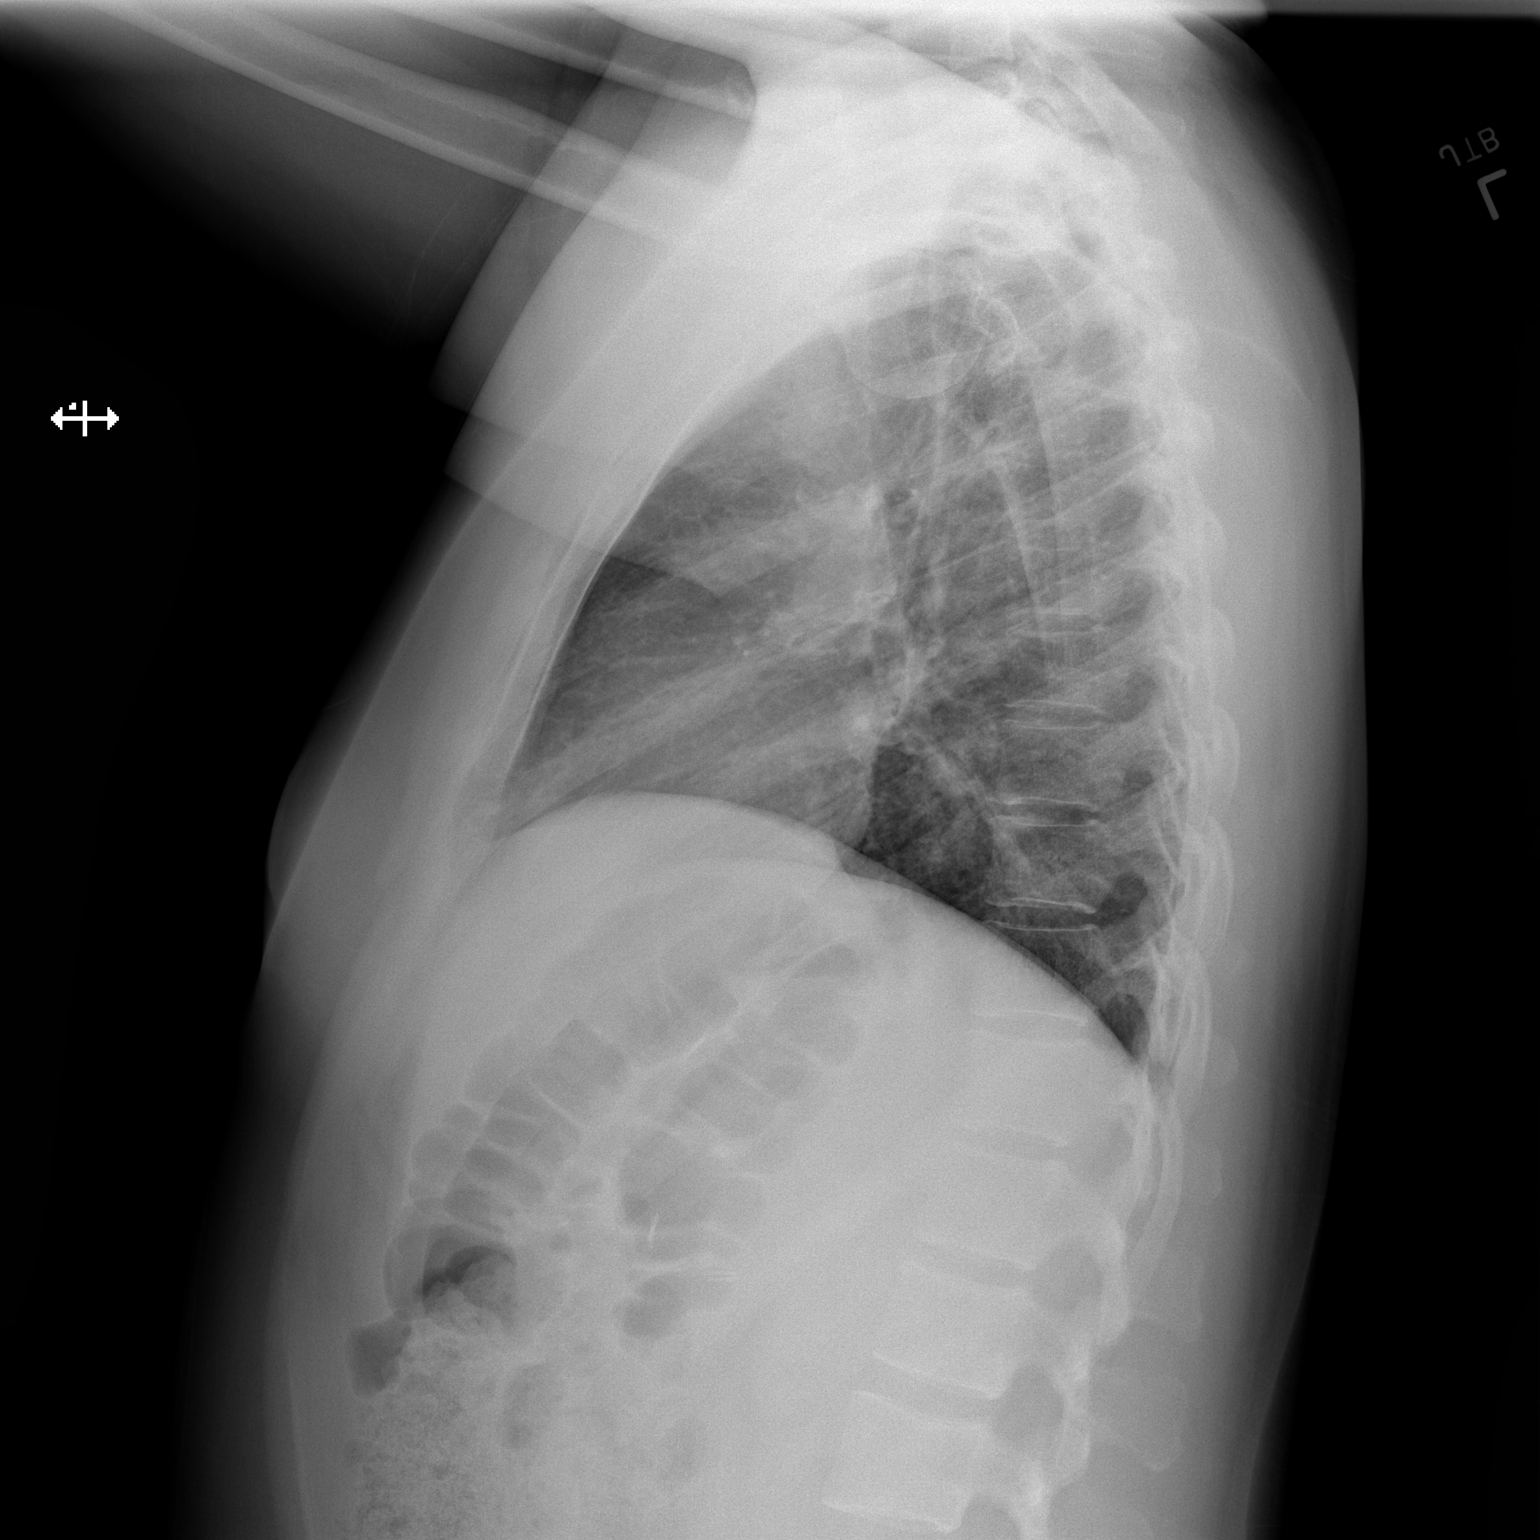

[2 of 2 positions shown; findings below may reference images not displayed]

FINDINGS: Lungs are clear.  No pleural effusion or pneumothorax.

The heart is normal in size.

Visualized osseous structures are within normal limits.
IMPRESSION: Normal chest radiographs.

## 2023-09-22 NOTE — Progress Notes (Unsigned)
 ANNUAL EXAM Patient name: Brittany Vargas MRN [id number]  Date of birth: March 05, 1991 Chief Complaint:   No chief complaint on file.  History of Present Illness:   Brittany Vargas is a 33 y.o. [phone] {race:25618} female being seen today for a routine annual exam.  Current complaints: ***  No LMP recorded. (Menstrual status: IUD).   The pregnancy intention screening data noted above was reviewed. Potential methods of contraception were discussed. The patient elected to proceed with No data recorded.   Last pap 08/06/2017, NILM. H/O abnormal pap: {yes/yes***/no:23866} Last mammogram: ***. Results were: {normal, abnormal, n/a:23837}. Family h/o breast cancer: {yes***/no:23838} Last colonoscopy: ***. Results were: {normal, abnormal, n/a:23837}. Family h/o colorectal cancer: {yes***/no:23838} STI screening: Contraception:     12/13/2013   11:22 AM  Depression screen PHQ 2/9  Decreased Interest 0  Down, Depressed, Hopeless 0  PHQ - 2 Score 0         No data to display           Review of Systems:   Pertinent items are noted in HPI Denies any headaches, blurred vision, fatigue, shortness of breath, chest pain, abdominal pain, abnormal vaginal discharge/itching/odor/irritation, problems with periods, bowel movements, urination, or intercourse unless otherwise stated above. Pertinent History Reviewed:  Reviewed past medical,surgical, social and family history.  Reviewed problem list, medications and allergies. Physical Assessment:  There were no vitals filed for this visit.There is no height or weight on file to calculate BMI.        Physical Examination:   General appearance - well appearing, and in no distress  Mental status - alert, oriented to person, place, and time  Psych:  She has a normal mood and affect  Skin - warm and dry, normal color, no suspicious lesions noted  Chest - effort normal, all lung fields clear to auscultation bilaterally  Heart - normal rate and  regular rhythm  Neck:  midline trachea, no thyromegaly or nodules  Breasts - breasts appear normal, no suspicious masses, no skin or nipple changes or  axillary nodes  Abdomen - soft, nontender, nondistended, no masses or organomegaly  Pelvic - VULVA: normal appearing vulva with no masses, tenderness or lesions  VAGINA: normal appearing vagina with normal color and discharge, no lesions  CERVIX: normal appearing cervix without discharge or lesions, no CMT  Thin prep pap is {Desc; done/not:10129} *** HR HPV cotesting  UTERUS: uterus is felt to be normal size, shape, consistency and nontender   ADNEXA: No adnexal masses or tenderness noted.  Rectal - normal rectal, good sphincter tone, no masses felt. Hemoccult: ***  Extremities:  No swelling or varicosities noted  Chaperone present for exam  No results found for this or any previous visit (from the past 24 hours).  Assessment & Plan:  1. Encounter for annual routine gynecological examination (Primary) ***  2. Cervical cancer screening ***   - Cervical cancer screening: Discussed guidelines. Pap with HPV *** - Gardasil: {Blank single:19197::"***","has not yet had. Will provide information","completed","has not yet had. Counseling provided and she declines","Has not yet had. Counseling provided and pt accepts"} - GC/CT: {Blank single:19197::"accepts","declines","not indicated"} - Birth Control: {Birth control type:23956} - Breast Health: Encouraged self breast awareness/SBE. Teaching provided.  - Mammogram: {Mammo f/u:25212::"@ 35yo"}, or sooner if problems - Colonoscopy: {TCS f/u:25213::"@ 35yo"}, or sooner if problems - F/U 12 months and prn     No orders of the defined types were placed in this encounter.   Meds: No orders of the  defined types were placed in this encounter.   Follow-up: No follow-ups on file.  Joylyn Duggin E Lakisa Lotz, [place] 09/22/2023 8:24 PM

## 2023-09-24 ENCOUNTER — Ambulatory Visit (INDEPENDENT_AMBULATORY_CARE_PROVIDER_SITE_OTHER): Admitting: Physician Assistant

## 2023-09-24 ENCOUNTER — Encounter: Payer: Self-pay | Admitting: Physician Assistant

## 2023-09-24 ENCOUNTER — Other Ambulatory Visit (HOSPITAL_COMMUNITY)
Admission: RE | Admit: 2023-09-24 | Discharge: 2023-09-24 | Disposition: A | Discharge status: Home / Self Care | Source: Ambulatory Visit | Attending: Physician Assistant | Admitting: Physician Assistant

## 2023-09-24 VITALS — BP 107/72 | HR 64 | Ht 64.0 in | Wt 206.6 lb

## 2023-09-24 DIAGNOSIS — N393 Stress incontinence (female) (male): Secondary | ICD-10-CM

## 2023-09-24 DIAGNOSIS — N8110 Cystocele, unspecified: Secondary | ICD-10-CM

## 2023-09-24 DIAGNOSIS — Z01419 Encounter for gynecological examination (general) (routine) without abnormal findings: Secondary | ICD-10-CM

## 2023-09-24 DIAGNOSIS — Z124 Encounter for screening for malignant neoplasm of cervix: Secondary | ICD-10-CM

## 2023-09-24 NOTE — Progress Notes (Signed)
 Pt presents for AEX Last PAP 2019 Requesting STD testing IUD placed 09-02-2016  Pt c/o incontinence with activity that has increased over many years . Pt also report swelling in the legs that is worse cause pain and redness when she drinks alcohol

## 2023-09-25 ENCOUNTER — Encounter: Payer: Self-pay | Admitting: Physician Assistant

## 2023-09-25 LAB — CERVICOVAGINAL ANCILLARY ONLY
Bacterial Vaginitis (gardnerella): POSITIVE — AB
Candida Glabrata: NEGATIVE
Candida Vaginitis: NEGATIVE
Chlamydia: NEGATIVE
Comment: NEGATIVE
Comment: NEGATIVE
Comment: NEGATIVE
Comment: NEGATIVE
Comment: NEGATIVE
Comment: NORMAL
Neisseria Gonorrhea: NEGATIVE
Trichomonas: NEGATIVE

## 2023-09-25 LAB — HEPATITIS C ANTIBODY: Hep C Virus Ab: NONREACTIVE

## 2023-09-25 LAB — RPR: RPR Ser Ql: NONREACTIVE

## 2023-09-25 LAB — HIV ANTIBODY (ROUTINE TESTING W REFLEX): HIV Screen 4th Generation wRfx: NONREACTIVE

## 2023-09-25 LAB — HEPATITIS B SURFACE ANTIGEN: Hepatitis B Surface Ag: NEGATIVE

## 2023-09-28 ENCOUNTER — Other Ambulatory Visit: Payer: Self-pay

## 2023-09-28 MED ORDER — METRONIDAZOLE 500 MG PO TABS: 500.0000 mg | ORAL_TABLET | Freq: Two times a day (BID) | ORAL | 0 refills | Status: DC

## 2023-09-30 LAB — CYTOLOGY - PAP
Comment: NEGATIVE
Comment: NEGATIVE
Comment: NEGATIVE
HPV 16: NEGATIVE
HPV 18 / 45: NEGATIVE
High risk HPV: POSITIVE — AB

## 2023-10-01 ENCOUNTER — Encounter: Payer: Self-pay | Admitting: Physician Assistant

## 2023-10-01 DIAGNOSIS — R87612 Low grade squamous intraepithelial lesion on cytologic smear of cervix (LGSIL): Secondary | ICD-10-CM | POA: Insufficient documentation

## 2023-10-03 ENCOUNTER — Ambulatory Visit: Payer: Self-pay | Admitting: Physician Assistant

## 2023-10-20 ENCOUNTER — Ambulatory Visit
Admission: RE | Admit: 2023-10-20 | Discharge: 2023-10-20 | Disposition: A | Discharge status: Home / Self Care | Source: Ambulatory Visit | Attending: Family Medicine | Admitting: Family Medicine

## 2023-10-20 VITALS — BP 91/60 | HR 89 | Temp 98.1°F | Resp 18

## 2023-10-20 DIAGNOSIS — L732 Hidradenitis suppurativa: Secondary | ICD-10-CM

## 2023-10-20 DIAGNOSIS — L02416 Cutaneous abscess of left lower limb: Secondary | ICD-10-CM

## 2023-10-20 MED ORDER — DOXYCYCLINE HYCLATE 100 MG PO CAPS
100.0000 mg | ORAL_CAPSULE | Freq: Two times a day (BID) | ORAL | 0 refills | Status: AC
Start: 2023-10-20 — End: ?

## 2023-10-20 MED ORDER — IBUPROFEN 600 MG PO TABS
600.0000 mg | ORAL_TABLET | Freq: Four times a day (QID) | ORAL | 0 refills | Status: AC | PRN
Start: 2023-10-20 — End: ?

## 2023-10-20 NOTE — ED Provider Notes (Signed)
 [facility]  Note:  This document was prepared using Conservation officer, historic buildings and may include unintentional dictation errors.  MRN: [id number] DOB: 1991/01/12  Subjective:   Brittany Vargas is a 33 y.o. female presenting for 2-day history of a painful bump over the medial left thigh.  Has a history of these kinds of bumps and infections, boils.  Currently feels that 1 is developing over the right medial thigh.  Has had them in the armpit and neck area as well.  Has never had an evaluation with a dermatologist for this.  No current facility-administered medications for this encounter.  Current Outpatient Medications:    metroNIDAZOLE  (FLAGYL ) 500 MG tablet, Take 1 tablet (500 mg total) by mouth 2 (two) times daily., Disp: 14 tablet, Rfl: 0   fluconazole  (DIFLUCAN ) 150 MG tablet, Take 1 tablet (150 mg total) by mouth every 3 (three) days. (Patient not taking: Reported on 09/24/2023), Disp: 5 tablet, Rfl: 0   lidocaine  (LIDODERM ) 5 %, Place 1 patch onto the skin daily. Remove & Discard patch within 12 hours (Patient not taking: Reported on 11/10/2022), Disp: 15 patch, Rfl: 0   metroNIDAZOLE  (FLAGYL ) 500 MG tablet, Take 1 tablet (500 mg total) by mouth 2 (two) times daily. (Patient not taking: Reported on 09/24/2023), Disp: 14 tablet, Rfl: 0   No Known Allergies  Past Medical History:  Diagnosis Date   Thrombocytopenia (HCC)      Past Surgical History:  Procedure Laterality Date   WISDOM TOOTH EXTRACTION      Family History  Problem Relation Age of Onset   Cancer Maternal Grandfather     Social History   Tobacco Use   Smoking status: Never   Smokeless tobacco: Never  Vaping Use   Vaping status: Never Used  Substance Use Topics   Alcohol use: Yes    Comment: occ   Drug use: No    ROS   Objective:   Vitals: BP 91/60 (BP Location: Left Arm)   Pulse 89   Temp 98.1 F (36.7 C) (Oral)   Resp 18   SpO2 96%   Physical  Exam Constitutional:      General: She is not in acute distress.    Appearance: Normal appearance. She is well-developed. She is not ill-appearing, toxic-appearing or diaphoretic.  HENT:     Head: Normocephalic and atraumatic.     Nose: Nose normal.     Mouth/Throat:     Mouth: Mucous membranes are moist.  Eyes:     General: No scleral icterus.       Right eye: No discharge.        Left eye: No discharge.     Extraocular Movements: Extraocular movements intact.  Cardiovascular:     Rate and Rhythm: Normal rate.  Pulmonary:     Effort: Pulmonary effort is normal.  Genitourinary:   Skin:    General: Skin is warm and dry.  Neurological:     General: No focal deficit present.     Mental Status: She is alert and oriented to person, place, and time.  Psychiatric:        Mood and Affect: Mood normal.        Behavior: Behavior normal.    PROCEDURE NOTE: I&D of Abscess Verbal consent obtained. Local anesthesia with 3cc of 2% lidocaine  with epinephrine. Site cleansed with alcohol swabs and Betadine. Incision of 1/4cm was made using an 11 blade, 5cc expressed consisting of a mixture of pus  and serosanguinous fluid. Wound cavity was explored with curved hemostats and loculations loosened. Cleansed and dressed.   Assessment and Plan :   PDMP not reviewed this encounter.  1. Abscess of left thigh   2. Hidradenitis    Successful I&D performed.  Wound care reviewed.  Start doxycycline for the abscess, ibuprofen  for pain and inflammation.  Referral placed to dermatology for high suspicion of hidradenitis.  Counseled patient on potential for adverse effects with medications prescribed/recommended today, ER and return-to-clinic precautions discussed, patient verbalized understanding.     Adolph Hoop, [place] 10/20/23 [phone]

## 2023-10-20 NOTE — Discharge Instructions (Addendum)
 Please change your dressing 3-5 times daily.  Each time you change your dressing, make sure that you are pressing on the wound to get pus to come out.  Try your best to clean the wound with antibacterial soap and warm water. Pat your wound dry and let it air out if possible to make sure it is dry before reapplying another dressing.   Start doxycyline for the infection. Use ibuprofen  for the pain. Follow up with dermatology.

## 2023-10-20 NOTE — ED Notes (Signed)
In with Mani, PA-C for exam 

## 2023-10-20 NOTE — ED Triage Notes (Signed)
 Painful bump between his thighs x 2 days. Patient denies any drainage. Pain 10/10

## 2023-10-26 ENCOUNTER — Ambulatory Visit (INDEPENDENT_AMBULATORY_CARE_PROVIDER_SITE_OTHER): Payer: Self-pay | Admitting: Obstetrics and Gynecology

## 2023-10-26 ENCOUNTER — Other Ambulatory Visit (HOSPITAL_COMMUNITY)
Admission: RE | Admit: 2023-10-26 | Discharge: 2023-10-26 | Disposition: A | Discharge status: Home / Self Care | Source: Ambulatory Visit | Attending: Obstetrics and Gynecology | Admitting: Obstetrics and Gynecology

## 2023-10-26 ENCOUNTER — Encounter: Admitting: Urology

## 2023-10-26 ENCOUNTER — Encounter: Payer: Self-pay | Admitting: Obstetrics and Gynecology

## 2023-10-26 VITALS — BP 100/68 | HR 79 | Wt 208.0 lb

## 2023-10-26 DIAGNOSIS — R87612 Low grade squamous intraepithelial lesion on cytologic smear of cervix (LGSIL): Secondary | ICD-10-CM | POA: Insufficient documentation

## 2023-10-26 DIAGNOSIS — R87810 Cervical high risk human papillomavirus (HPV) DNA test positive: Secondary | ICD-10-CM

## 2023-10-26 DIAGNOSIS — Z3202 Encounter for pregnancy test, result negative: Secondary | ICD-10-CM

## 2023-10-26 NOTE — Progress Notes (Signed)
 Pt is in office for Colpo, has no concerns today. Pt has no cycles with IUD, last intercourse about a month ago.

## 2023-10-26 NOTE — Progress Notes (Signed)
 33 yo P3 with LGSIL + HRHPV 09/2023 here for colposcopy. Patient is without any complaints  Past Medical History:  Diagnosis Date   Thrombocytopenia (HCC)    Past Surgical History:  Procedure Laterality Date   WISDOM TOOTH EXTRACTION     Family History  Problem Relation Age of Onset   Cancer Maternal Grandfather    Social History   Tobacco Use   Smoking status: Never   Smokeless tobacco: Never  Vaping Use   Vaping status: Never Used  Substance Use Topics   Alcohol use: Yes    Comment: occ   Drug use: No   ROS See pertinent in HPI. All other systems reviewed and non contributory Blood pressure 100/68, pulse 79, weight 208 lb (94.3 kg). GENERAL: Well-developed, well-nourished female in no acute distress.  PELVIC: Normal external female genitalia with 2 small boils, one recently drained. Vagina is pink and rugated.  Normal discharge. Normal appearing cervix. Chaperone present during the pelvic exam EXTREMITIES: No cyanosis, clubbing, or edema, 2+ distal pulses.  A/P 33 yo here for colposcopy Patient given informed consent, signed copy in the chart, time out was performed.  Placed in lithotomy position. Cervix viewed with speculum and colposcope after application of acetic acid.   Colposcopy adequate?  yes Acetowhite lesions?yes 12 and 5-8 o'clock Punctation?no Mosaicism?  no Abnormal vasculature?  no Biopsies?yes 12 and 6 o'clock ECC?yes  COMMENTS: Patient was given post procedure instructions.  She will return in 2 weeks for results. Discussed benefits of Gardasil vaccine  Verlyn Goad, MD

## 2023-10-27 LAB — POCT URINE PREGNANCY: Preg Test, Ur: NEGATIVE

## 2023-10-28 LAB — SURGICAL PATHOLOGY

## 2023-10-29 ENCOUNTER — Ambulatory Visit: Payer: Self-pay | Admitting: Obstetrics and Gynecology

## 2023-11-12 ENCOUNTER — Ambulatory Visit: Payer: Self-pay | Attending: Physician Assistant | Admitting: Physical Therapy

## 2023-11-12 ENCOUNTER — Other Ambulatory Visit: Payer: Self-pay

## 2023-11-12 ENCOUNTER — Encounter: Payer: Self-pay | Admitting: Physical Therapy

## 2023-11-12 DIAGNOSIS — R279 Unspecified lack of coordination: Secondary | ICD-10-CM | POA: Insufficient documentation

## 2023-11-12 DIAGNOSIS — N393 Stress incontinence (female) (male): Secondary | ICD-10-CM | POA: Insufficient documentation

## 2023-11-12 DIAGNOSIS — R293 Abnormal posture: Secondary | ICD-10-CM | POA: Insufficient documentation

## 2023-11-12 DIAGNOSIS — M6281 Muscle weakness (generalized): Secondary | ICD-10-CM | POA: Insufficient documentation

## 2023-11-12 NOTE — Therapy (Signed)
 OUTPATIENT PHYSICAL THERAPY FEMALE PELVIC EVALUATION   Patient Name: Brittany Vargas MRN: [id number] DOB:Jul 15, 1990, 33 y.o., female Today's Date: 11/12/2023  END OF SESSION:  PT End of Session - 11/12/23 1140     Visit Number 1    Number of Visits 6    Date for PT Re-Evaluation 12/10/23    Authorization Type Self Pay    PT Start Time 1100    PT Stop Time 1145    PT Time Calculation (min) 45 min    Activity Tolerance Patient tolerated treatment well    Behavior During Therapy [facility] for tasks assessed/performed          Past Medical History:  Diagnosis Date   Thrombocytopenia (HCC)    Past Surgical History:  Procedure Laterality Date   WISDOM TOOTH EXTRACTION     Patient Active Problem List   Diagnosis Date Noted   LGSIL on Pap smear of cervix 10/01/2023   Thrombocytopenia complicating pregnancy (HCC) 12/05/2013    PCP: Davis, Devon E, PA-C   REFERRING PROVIDER: Nicholaus Jorene BRAVO, PA-C   REFERRING DIAG: N39.3 (ICD-10-CM) - Stress incontinence  THERAPY DIAG:  Muscle weakness (generalized)  Unspecified lack of coordination  Abnormal posture  Rationale for Evaluation and Treatment: Rehabilitation  ONSET DATE: around age 37   SUBJECTIVE:                                                                                                                                                                                          SUBJECTIVE STATEMENT: Patient reports to PFPT with complaint of stress urinary incontinence with exercise. She has had this issue since she was 24, and it is continuing to get worse each year.  Fluid intake: drinks 4 bottles of water daily, 1 cup of coffee, rarely drinks soda, alcohol occasionally   PAIN:  Are you having pain? No NPRS scale: 0/10  PRECAUTIONS: None  RED FLAGS: None   WEIGHT BEARING RESTRICTIONS: No  FALLS:  Has patient fallen in last 6 months? No  OCCUPATION: works 3rd shift, full time, on her feet a lot   ACTIVITY  LEVEL : exercises regularly including Zumba (cannot do anymore), lifts weights, plays soccer with her kids   PLOF: Independent with basic ADLs  PATIENT GOALS: decrease urinary leakage   PERTINENT HISTORY:  Sexual abuse: No  BOWEL MOVEMENT: Pain with bowel movement: No Type of bowel movement:Type (Bristol Stool Scale) 4, Frequency 1-2x/day, Strain no, and Splinting no Fully empty rectum: No Leakage: No Pads: No Fiber supplement/laxative Yes - Miralax when constipated   URINATION: Pain with urination: No Fully empty bladder: Nofeels like she could  go shortly after voiding  Stream: Strong Urgency: Yes  Frequency: feels like she goes to pee all the time during the day Leakage: Urge to void, Coughing, Sneezing, Laughing, Exercise, Lifting, and Intercourse Pads: No  INTERCOURSE: currently sexually active   Ability to have vaginal penetration No  Pain with intercourse: Deep Penetration DrynessNo Climax: yes Marinoff Scale: 2/3  PREGNANCY: Vaginal deliveries 3 Tearing No Episiotomy No C-section deliveries 0 Currently pregnant No  PROLAPSE: None  OBJECTIVE:  Note: Objective measures were completed at Evaluation unless otherwise noted.  PATIENT SURVEYS:  PFIQ-7: 45  COGNITION: Overall cognitive status: Within functional limits for tasks assessed     SENSATION: Light touch: Appears intact  LUMBAR SPECIAL TESTS:  Single leg stance test: Positive  FUNCTIONAL TESTS:  Squat: bilateral dynamic knee valgus with loading and general lumbopelvic stiffness present   GAIT: Assistive device utilized: None Comments: mild trende  POSTURE: rounded shoulders, forward head, and flexed trunk   LUMBARAROM/PROM: within functional limits for all motions bilaterally   LOWER EXTREMITY ROM: within functional limits for all motions bilaterally  LOWER EXTREMITY MMT: 4/5 bilateral knees and hips grossly   PALPATION:   General: no significant tenderness to palpation of bilateral  adductors or hip flexors   Pelvic Alignment: within normal limits   Abdominal: upper chest breathing and abdominal bracing present at rest, decreased lower rib excursion with inhalation                 External Perineal Exam: minimal dryness present with sufficient clitoral hood mobility present also. There is a visible grade 1 anterior wall vaginal prolapse present in this position.                             Internal Pelvic Floor: Patient fully consents to today's internal examination. She demonstrates low tone throughout bilateral aspects of superficial and deep pelvic floor musculature. No pain with today's exam. She has a weak pelvic floor contraction that is uncoordinated with her breathing cues. She is able to contract her pelvic floor more effectively when she pairs the contraction with an exhale, so we provided her with this exercise today. No pain following today's examination.  Patient confirms identification and approves PT to assess internal pelvic floor and treatment Yes No emotional/communication barriers or cognitive limitation. Patient is motivated to learn. Patient understands and agrees with treatment goals and plan. PT explains patient will be examined in standing, sitting, and lying down to see how their muscles and joints work. When they are ready, they will be asked to remove their underwear so PT can examine their perineum. The patient is also given the option of providing their own chaperone as one is not provided in our facility. The patient also has the right and is explained the right to defer or refuse any part of the evaluation or treatment including the internal exam. With the patient's consent, PT will use one gloved finger to gently assess the muscles of the pelvic floor, seeing how well it contracts and relaxes and if there is muscle symmetry. After, the patient will get dressed and PT and patient will discuss exam findings and plan of care. PT and patient discuss plan  of care, schedule, attendance policy and HEP activities.  PELVIC MMT:   MMT eval  Vaginal 3/5, 10 quick flicks, 5 second hold  Internal Anal Sphincter   External Anal Sphincter   Puborectalis   Diastasis Recti   (  Blank rows = not tested)        TONE: Decreased throughout superficial and deep pelvic floor bilaterally   PROLAPSE: Grade 1 anterior wall prolapse present in hooklying position  TODAY'S TREATMENT:                                                                                                                              DATE:   EVAL 11/12/23: Examination completed, findings reviewed, pt educated on POC, HEP, and self care. Pt motivated to participate in PT and agreeable to attempt recommendations.   Neuro re-ed: Hooklying diaphragmatic breathing + pelvic floor lengthening with inhalation and shortening with exhalation 2x10  Quick Flick Pelvic Floor Contractions in Hooklying  - 1 x daily - 7 x weekly - 2 sets - 10 reps Supine Butterfly Groin Stretch  - 1 x daily - 7 x weekly - 2 sets - 10 reps Supine Lower Trunk Rotation  - 1 x daily - 7 x weekly - 2 sets - 10 reps Self care: Vaginal moisturizers vs lubricants, water intake recommendations and education on bladder irritants, pelvic floor relative anatomy and the connection between the diaphragm and pelvic floor   PATIENT EDUCATION:  Education details: Vaginal moisturizers vs lubricants, water intake recommendations and education on bladder irritants, pelvic floor relative anatomy and the connection between the diaphragm and pelvic floor  Person educated: Patient Education method: Explanation, Demonstration, Tactile cues, Verbal cues, and Handouts Education comprehension: verbalized understanding, returned demonstration, verbal cues required, tactile cues required, and needs further education  HOME EXERCISE PROGRAM: Access Code: [email or web address] URL: [email or web address] Date: 11/12/2023 Prepared by: Celena Domino  Exercises - Supine Pelvic Floor Contraction  - 1 x daily - 7 x weekly - 2 sets - 10 reps - Quick Flick Pelvic Floor Contractions in Hooklying  - 1 x daily - 7 x weekly - 2 sets - 10 reps - Supine Butterfly Groin Stretch  - 1 x daily - 7 x weekly - 2 sets - 10 reps - Supine Lower Trunk Rotation  - 1 x daily - 7 x weekly - 2 sets - 10 reps  ASSESSMENT:  CLINICAL IMPRESSION: Patient is a 33 y.o. female  who was seen today for physical therapy evaluation and treatment for stress incontinence that has been present since she was 33 years old. This has progressively gotten worse as time has passed, and now she is leaking with sneezing/coughing/laughing/intercourse/exercise.  Patient fully consents to today's internal examination. She demonstrates low tone throughout bilateral aspects of superficial and deep pelvic floor musculature. No pain with today's exam. She has a weak pelvic floor contraction that is uncoordinated with her breathing cues. She is able to contract her pelvic floor more effectively when she pairs the contraction with an exhale, so we provided her with this exercise today. No pain following today's examination.  OBJECTIVE IMPAIRMENTS: decreased coordination, decreased endurance, decreased mobility, decreased ROM, and decreased strength.   ACTIVITY LIMITATIONS: continence  PARTICIPATION LIMITATIONS: N/A  PERSONAL FACTORS: Past/current experiences and Time since onset of injury/illness/exacerbation are also affecting patient's functional outcome.   REHAB POTENTIAL: Good  CLINICAL DECISION MAKING: Stable/uncomplicated  EVALUATION COMPLEXITY: Low   GOALS: Goals reviewed with patient? Yes  SHORT TERM GOALS: Target date: 12/10/2023  Pt will be independent with HEP.  Baseline: Goal status: INITIAL  2.  Pt will be independent with diaphragmatic breathing and down training activities in order to improve pelvic floor relaxation. Baseline:  Goal status: INITIAL  3.  Pt  will be independent with the knack, urge suppression technique, and double voiding in order to improve bladder habits and decrease urinary incontinence.   Baseline:  Goal status: INITIAL  4.  Pt will be independent with use of squatty potty, relaxed toileting mechanics, and improved bowel movement techniques in order to increase ease of bowel movements and complete evacuation.   Baseline:  Goal status: INITIAL  LONG TERM GOALS: Target date: 05/13/2024  Pt will be independent with advanced HEP.  Baseline:  Goal status: INITIAL  2.  Pt to demonstrate improved coordination of pelvic floor and breathing mechanics with 10# squat with appropriate synergistic patterns to decrease pain and leakage at least 75% of the time for improved ability to complete a 30 minute workout with strain at pelvic floor and symptoms.   Baseline:  Goal status: INITIAL  3.  Pt will be able to functional actions such as play soccer in the yard with her children without leakage to improve quality of life. Baseline:  Goal status: INITIAL  4.  Pt will be able to correctly perform diaphragmatic breathing and appropriate pressure management in order to prevent worsening vaginal wall laxity and improve pelvic floor A/ROM.   Baseline:  Goal status: INITIAL  PLAN:  PT FREQUENCY: 1-2x/week  PT DURATION: 6 months  PLANNED INTERVENTIONS: 97110-Therapeutic exercises, 97530- Therapeutic activity, 97112- Neuromuscular re-education, 97535- Self Care, [id number]- Manual therapy, Patient/Family education, Stair training, Taping, Joint mobilization, Spinal mobilization, Scar mobilization, Cryotherapy, and Moist heat  PLAN FOR NEXT SESSION: double voiding technique and tileting mechanics for voiding/pooping    Celena JAYSON Domino, PT 11/12/2023, 11:41 AM

## 2023-12-29 ENCOUNTER — Ambulatory Visit: Payer: Self-pay | Attending: Physician Assistant | Admitting: Physical Therapy

## 2023-12-29 DIAGNOSIS — R279 Unspecified lack of coordination: Secondary | ICD-10-CM | POA: Insufficient documentation

## 2023-12-29 DIAGNOSIS — R293 Abnormal posture: Secondary | ICD-10-CM | POA: Insufficient documentation

## 2023-12-29 DIAGNOSIS — N393 Stress incontinence (female) (male): Secondary | ICD-10-CM | POA: Insufficient documentation

## 2023-12-29 DIAGNOSIS — M6281 Muscle weakness (generalized): Secondary | ICD-10-CM | POA: Insufficient documentation

## 2024-05-25 ENCOUNTER — Ambulatory Visit: Admitting: Physician Assistant
